# Patient Record
Sex: Female | Born: 1995 | Race: Black or African American | Hispanic: No | Marital: Single | Smoking: Current some day smoker
Health system: Southern US, Community
[De-identification: ages and names within clinical notes are randomized; demographics above are authoritative.]

## PROBLEM LIST (undated history)

## (undated) DIAGNOSIS — J45909 Unspecified asthma, uncomplicated: Secondary | ICD-10-CM

## (undated) DIAGNOSIS — N83209 Unspecified ovarian cyst, unspecified side: Secondary | ICD-10-CM

## (undated) HISTORY — PX: NO PAST SURGERIES: SHX2092

---

## 2018-09-29 ENCOUNTER — Ambulatory Visit
Admission: EM | Admit: 2018-09-29 | Discharge: 2018-09-29 | Disposition: A | Discharge status: Home / Self Care | Payer: Self-pay | Attending: Family Medicine | Admitting: Family Medicine

## 2018-09-29 ENCOUNTER — Other Ambulatory Visit: Payer: Self-pay

## 2018-09-29 DIAGNOSIS — R1084 Generalized abdominal pain: Secondary | ICD-10-CM

## 2018-09-29 MED ORDER — TRAMADOL HCL 50 MG PO TABS: 50.0000 mg | ORAL_TABLET | Freq: Three times a day (TID) | ORAL | 0 refills | Status: DC | PRN

## 2018-09-29 MED ORDER — PROMETHAZINE HCL 25 MG/ML IJ SOLN
25.0000 mg | Freq: Once | INTRAMUSCULAR | Status: AC
  Administered 2018-09-29: 25 mg via INTRAMUSCULAR

## 2018-09-29 MED ORDER — KETOROLAC TROMETHAMINE 60 MG/2ML IM SOLN
60.0000 mg | Freq: Once | INTRAMUSCULAR | Status: AC
  Administered 2018-09-29: 60 mg via INTRAMUSCULAR

## 2018-09-29 NOTE — ED Provider Notes (Signed)
[facility]    CSN: [id number] Arrival date & time: 09/29/18  1650  History   Chief Complaint Chief Complaint  Patient presents with  . Abdominal Pain   HPI  23 year old female presents with severe abdominal pain.  Patient in acute distress due to pain.  History is obtained from her girlfriend.  Per the girlfriend, she has painful menses.  She is complaining of severe abdominal pain.  Patient briefly endorses the fact that she has a history of ovarian cyst.  She has been vomiting as well.  Patient states that she often has to go to the hospital for this.  She states that Toradol often helps her pain.  No other reported symptoms.  No known relieving factors.  No other complaints.  Hx reviewed as below. PMH: Painful menses  Past Surgical History:  Procedure Laterality Date  . NO PAST SURGERIES      OB History   No obstetric history on file.      Home Medications    Prior to Admission medications   Medication Sig Start Date End Date Taking? Authorizing Provider  traMADol (ULTRAM) 50 MG tablet Take 1 tablet (50 mg total) by mouth every 8 (eight) hours as needed for severe pain. 09/29/18   Tommie Sams, DO   Social History Social History   Tobacco Use  . Smoking status: Never Smoker  . Smokeless tobacco: Never Used  Substance Use Topics  . Alcohol use: Never    Frequency: Never  . Drug use: Not Currently     Allergies   Patient has no known allergies.   Review of Systems Review of Systems  Constitutional: Negative for fever.  Gastrointestinal: Positive for abdominal pain, nausea and vomiting.   Physical Exam Triage Vital Signs ED Triage Vitals [09/29/18 1701]  Enc Vitals Group     BP (!) 149/97     Pulse Rate 78     Resp 18     Temp 98 F (36.7 C)     Temp Source Oral     SpO2 100 %     Weight 140 lb (63.5 kg)     Height      Head Circumference      Peak Flow      Pain Score 10     Pain Loc      Pain Edu?      Excl. in GC?     Updated Vital Signs BP (!) 149/97 (BP Location: Left Arm)   Pulse 78   Temp 98 F (36.7 C) (Oral)   Resp 18   Wt 63.5 kg   LMP 09/29/2018   SpO2 100%   Visual Acuity Right Eye Distance:   Left Eye Distance:   Bilateral Distance:    Right Eye Near:   Left Eye Near:    Bilateral Near:     Physical Exam Vitals signs and nursing note reviewed.  Constitutional:      Appearance: She is ill-appearing.     Comments: In distress due to pain.  HENT:     Head: Normocephalic and atraumatic.     Nose: Nose normal.  Eyes:     General:        Right eye: No discharge.        Left eye: No discharge.     Conjunctiva/sclera: Conjunctivae normal.  Cardiovascular:     Rate and Rhythm: Normal rate and regular rhythm.  Pulmonary:     Effort: Pulmonary effort is normal. No  respiratory distress.     Breath sounds: No wheezing or rales.  Abdominal:     General: Abdomen is flat. There is no distension.  Neurological:     Mental Status: She is alert.  Psychiatric:     Comments: Crying (pain).    UC Treatments / Results  Labs (all labs ordered are listed, but only abnormal results are displayed) Labs Reviewed - No data to display  EKG None  Radiology No results found.  Procedures Procedures (including critical care time)  Medications Ordered in UC Medications  promethazine (PHENERGAN) injection 25 mg (25 mg Intramuscular Given 09/29/18 1730)  ketorolac (TORADOL) injection 60 mg (60 mg Intramuscular Given 09/29/18 1729)    Initial Impression / Assessment and Plan / UC Course  I have reviewed the triage vital signs and the nursing notes.  Pertinent labs & imaging results that were available during my care of the patient were reviewed by me and considered in my medical decision making (see chart for details).    23 year old female presents with severe abdominal pain.  This appears to be secondary to menstrual cramps.  IM Toradol and Phenergan given with improvement in pain to  5/10 in severity.  Patient stated that she wanted to go home.  Tramadol given to be used as needed.  Patient is to follow-up with her primary care physician.  Final Clinical Impressions(s) / UC Diagnoses   Final diagnoses:  Generalized abdominal pain     Discharge Instructions     Medication as prescribed.  If pain is uncontrolled, please go to the hospital.  Take care  Dr. Adriana Simas    ED Prescriptions    Medication Sig Dispense Auth. Provider   traMADol (ULTRAM) 50 MG tablet Take 1 tablet (50 mg total) by mouth every 8 (eight) hours as needed for severe pain. 10 tablet Tommie Sams, DO     Controlled Substance Prescriptions [place] Controlled Substance Registry consulted? Not Applicable   Tommie Sams, DO 09/29/18 4098

## 2018-09-29 NOTE — ED Triage Notes (Signed)
Patient complains of abdominal pain that started this morning. Patient girlfriend reports that her menstrual cycle normally causes this pain. Patient reports that they here visiting from [place]. Patient vomiting during triage.

## 2018-09-29 NOTE — Discharge Instructions (Signed)
Medication as prescribed.  If pain is uncontrolled, please go to the hospital.  Take care  Dr. Adriana Simas

## 2019-02-06 ENCOUNTER — Encounter: Payer: Self-pay | Admitting: Emergency Medicine

## 2019-02-06 ENCOUNTER — Other Ambulatory Visit: Payer: Self-pay

## 2019-02-06 ENCOUNTER — Ambulatory Visit
Admission: EM | Admit: 2019-02-06 | Discharge: 2019-02-06 | Disposition: A | Discharge status: Home / Self Care | Payer: Self-pay | Attending: Family Medicine | Admitting: Family Medicine

## 2019-02-06 DIAGNOSIS — R102 Pelvic and perineal pain: Secondary | ICD-10-CM

## 2019-02-06 MED ORDER — KETOROLAC TROMETHAMINE 10 MG PO TABS: 10.0000 mg | ORAL_TABLET | Freq: Four times a day (QID) | ORAL | 0 refills | Status: DC | PRN

## 2019-02-06 MED ORDER — KETOROLAC TROMETHAMINE 60 MG/2ML IM SOLN
60.0000 mg | Freq: Once | INTRAMUSCULAR | Status: AC
  Administered 2019-02-06: 11:00:00 60 mg via INTRAMUSCULAR

## 2019-02-06 MED ORDER — PROMETHAZINE HCL 25 MG PO TABS: 25.0000 mg | ORAL_TABLET | Freq: Three times a day (TID) | ORAL | 0 refills | Status: DC | PRN

## 2019-02-06 MED ORDER — ONDANSETRON HCL 4 MG/2ML IJ SOLN
4.0000 mg | Freq: Once | INTRAMUSCULAR | Status: AC
  Administered 2019-02-06: 4 mg via INTRAMUSCULAR

## 2019-02-06 NOTE — ED Provider Notes (Signed)
[facility]    CSN: [id number] Arrival date & time: 02/06/19  1011  History   Chief Complaint Chief Complaint  Patient presents with  . Abdominal Pain   HPI  23 year old female presents with abdominal pain.  I have seen the patient previously for this in March.  She presents similarly today.  Patient is in acute distress and is in severe pain (10/10).  Patient states that she drove herself here.  She has severe dysmenorrhea.  She reports severe lower abdominal pain/cramping.  She has previously endorsed to me that she has a history of ovarian cyst.  She has had some diarrhea and some vomiting in addition to her severe pain.  No relieving factors.  No other reported symptoms.  History reviewed as below. PMH: Dysmenorrhea  Past Surgical History:  Procedure Laterality Date  . NO PAST SURGERIES      OB History   No obstetric history on file.    Home Medications    Prior to Admission medications   Medication Sig Start Date End Date Taking? Authorizing Provider  ketorolac (TORADOL) 10 MG tablet Take 1 tablet (10 mg total) by mouth every 6 (six) hours as needed for moderate pain or severe pain. 02/06/19   Tommie Samsook, Branon Sabine G, DO  promethazine (PHENERGAN) 25 MG tablet Take 1 tablet (25 mg total) by mouth every 8 (eight) hours as needed for nausea or vomiting. 02/06/19   Tommie Samsook, Dian Minahan G, DO  traMADol (ULTRAM) 50 MG tablet Take 1 tablet (50 mg total) by mouth every 8 (eight) hours as needed for severe pain. 09/29/18   Tommie Samsook, Quantae Martel G, DO    Social History Social History   Tobacco Use  . Smoking status: Never Smoker  . Smokeless tobacco: Never Used  Substance Use Topics  . Alcohol use: Never    Frequency: Never  . Drug use: Not Currently     Allergies   Patient has no known allergies.   Review of Systems Review of Systems  Gastrointestinal: Positive for abdominal pain.  Genitourinary: Positive for menstrual problem.   Physical Exam Triage Vital Signs ED Triage  Vitals  Enc Vitals Group     BP 02/06/19 1022 (!) 105/57     Pulse Rate 02/06/19 1022 79     Resp 02/06/19 1022 20     Temp 02/06/19 1022 98.4 F (36.9 C)     Temp Source 02/06/19 1022 Oral     SpO2 02/06/19 1022 100 %     Weight --      Height --      Head Circumference --      Peak Flow --      Pain Score 02/06/19 1019 10     Pain Loc --      Pain Edu? --      Excl. in GC? --    Updated Vital Signs BP (!) 105/57 (BP Location: Right Arm)   Pulse 79   Temp 98.4 F (36.9 C) (Oral)   Resp 20   LMP 02/05/2019 (Exact Date)   SpO2 100%   Visual Acuity Right Eye Distance:   Left Eye Distance:   Bilateral Distance:    Right Eye Near:   Left Eye Near:    Bilateral Near:     Physical Exam Vitals signs and nursing note reviewed.  Constitutional:      Comments: Patient in distress secondary to severe pain.  She is holding her lower abdomen and is curled up in the fetal position.  Patient is crying.  HENT:     Head: Normocephalic and atraumatic.  Eyes:     General:        Right eye: No discharge.        Left eye: No discharge.     Conjunctiva/sclera: Conjunctivae normal.  Cardiovascular:     Rate and Rhythm: Normal rate and regular rhythm.  Pulmonary:     Effort: Pulmonary effort is normal.     Breath sounds: Normal breath sounds.  Abdominal:     General: Abdomen is flat. There is no distension.  Skin:    General: Skin is warm.     Findings: No rash.  Psychiatric:     Comments: Patient is crying in pain.    UC Treatments / Results  Labs (all labs ordered are listed, but only abnormal results are displayed) Labs Reviewed - No data to display  EKG   Radiology No results found.  Procedures Procedures (including critical care time)  Medications Ordered in UC Medications  ketorolac (TORADOL) injection 60 mg (60 mg Intramuscular Given 02/06/19 1031)  ondansetron (ZOFRAN) injection 4 mg (4 mg Intramuscular Given 02/06/19 1031)    Initial Impression /  Assessment and Plan / UC Course  I have reviewed the triage vital signs and the nursing notes.  Pertinent labs & imaging results that were available during my care of the patient were reviewed by me and considered in my medical decision making (see chart for details).    23 year old female presents with pelvic pain/dysmenorrhea.  Toradol and Zofran given today with market improvement in pain to 4/10 in severity.  Discharging home on Toradol and Phenergan.  Final Clinical Impressions(s) / UC Diagnoses   Final diagnoses:  Pelvic pain     Discharge Instructions     Rest.  Fluids.  Medication as directed. Do not start until after 5 pm as we gave you a dose here.  Take care  Dr. Lacinda Axon     ED Prescriptions    Medication Sig Dispense Auth. Provider   ketorolac (TORADOL) 10 MG tablet Take 1 tablet (10 mg total) by mouth every 6 (six) hours as needed for moderate pain or severe pain. 20 tablet Lyndell Gillyard G, DO   promethazine (PHENERGAN) 25 MG tablet Take 1 tablet (25 mg total) by mouth every 8 (eight) hours as needed for nausea or vomiting. 30 tablet Coral Spikes, DO     Controlled Substance Prescriptions [place] Controlled Substance Registry consulted? Not Applicable   Coral Spikes, DO 02/06/19 1127

## 2019-02-06 NOTE — ED Triage Notes (Signed)
Pt c/o lower abdominal pain. She states that it started this morning. She started her menstrual cycle last night and she has had pain in the past like this when her periods. She has had diarrhea this morning.

## 2019-02-06 NOTE — Discharge Instructions (Signed)
Rest.  Fluids.  Medication as directed. Do not start until after 5 pm as we gave you a dose here.  Take care  Dr. Lacinda Axon

## 2019-03-28 ENCOUNTER — Ambulatory Visit
Admission: EM | Admit: 2019-03-28 | Discharge: 2019-03-28 | Disposition: A | Discharge status: Home / Self Care | Payer: Self-pay | Attending: Emergency Medicine | Admitting: Emergency Medicine

## 2019-03-28 ENCOUNTER — Encounter: Payer: Self-pay | Admitting: Emergency Medicine

## 2019-03-28 ENCOUNTER — Other Ambulatory Visit: Payer: Self-pay

## 2019-03-28 DIAGNOSIS — N946 Dysmenorrhea, unspecified: Secondary | ICD-10-CM

## 2019-03-28 MED ORDER — TRAMADOL HCL 50 MG PO TABS: 50.0000 mg | ORAL_TABLET | Freq: Three times a day (TID) | ORAL | 0 refills | Status: DC | PRN

## 2019-03-28 MED ORDER — KETOROLAC TROMETHAMINE 60 MG/2ML IM SOLN
60.0000 mg | Freq: Once | INTRAMUSCULAR | Status: AC
  Administered 2019-03-28: 11:00:00 60 mg via INTRAMUSCULAR

## 2019-03-28 NOTE — ED Triage Notes (Signed)
Patient c/o pelvic pain that started this morning.  Patient states that she started her menstrual cycle yesterday.  Patient states that she took Pamprin about a hour ago.

## 2019-03-28 NOTE — Discharge Instructions (Addendum)
Take medication as prescribed.  Over-the-counter ibuprofen 3 times a day.  Heating pad to abdomen.  Drink plenty of water.  Rest.  It is very important for you to follow-up with OB/GYN as discussed.  See above to call.  Follow up with your primary care physician this week as needed.  Return to urgent care as needed.  For any increased pain, pain not improving today or worsening concerns proceed directly to the emergency room as discussed.

## 2019-03-28 NOTE — ED Provider Notes (Addendum)
[facility] ____________________________________________  Time seen: Approximately 11:40 AM  I have reviewed the triage vital signs and the nursing notes.   HISTORY  Chief Complaint Pelvic Pain  HPI Alice Lewis is a 23 y.o. female presenting for evaluation of lower abdominal cramping and painful menstrual cycle.  Patient has a history of severe dysmenorrhea.  Patient has been seen twice before this year in urgent care for the same complaints with same presentation per patient.  Patient states her menstrual cycle started yesterday and has gradually increase in lower abdominal cramping pain.  Denies aggravating or alleviating factors.  No pain radiation.  States feels consistent with previous menstrual cramping pain.  Reports years ago she was diagnosed with ovarian cyst without any further complication.  No vomiting, diarrhea, fevers, chest pain, shortness of breath or recent sickness.  Patient reports she is in a monogamous homosexual relationship, declines pregnancy and declines any concerns of STDs.  No dysuria.  Took 1 Pamprin this morning without resolution.  States she has not been seen by OB/GYN for this.   past medical history Dysmenorrhea  There are no active problems to display for this patient.   Past Surgical History:  Procedure Laterality Date  . NO PAST SURGERIES       No current facility-administered medications for this encounter.   Current Outpatient Medications:  .  traMADol (ULTRAM) 50 MG tablet, Take 1 tablet (50 mg total) by mouth every 8 (eight) hours as needed., Disp: 8 tablet, Rfl: 0  Allergies Patient has no known allergies.   family history Aunt: Dysmenorrhea  Social History Social History   Tobacco Use  . Smoking status: Never Smoker  . Smokeless tobacco: Never Used  Substance Use Topics  . Alcohol use: Never    Frequency: Never  . Drug use: Not Currently    Review of Systems Constitutional: No fever ENT: No sore  throat. Cardiovascular: Denies chest pain. Respiratory: Denies shortness of breath. Gastrointestinal: Positive abdominal pain.  No nausea, no vomiting.  No diarrhea.  No constipation. Genitourinary: Negative for dysuria. Skin: Negative for rash.  ____________________________________________   PHYSICAL EXAM:  VITAL SIGNS: ED Triage Vitals  Enc Vitals Group     BP 03/28/19 1047 105/77     Pulse Rate 03/28/19 1047 73     Resp 03/28/19 1047 16     Temp 03/28/19 1047 98.2 F (36.8 C)     Temp Source 03/28/19 1047 Oral     SpO2 03/28/19 1047 100 %     Weight 03/28/19 1046 139 lb 15.9 oz (63.5 kg)     Height --      Head Circumference --      Peak Flow --      Pain Score 03/28/19 1046 10     Pain Loc --      Pain Edu? --      Excl. in [place]? --     Constitutional: Alert and oriented.  ENT      Head: Normocephalic and atraumatic. Cardiovascular: Normal rate, regular rhythm. Grossly normal heart sounds.  Good peripheral circulation. Respiratory: Normal respiratory effort without tachypnea nor retractions. Breath sounds are clear and equal bilaterally. No wheezes, rales, rhonchi. Gastrointestinal:  Normal Bowel sounds.  Minimal suprapubic and diffuse lower suprapubic tenderness palpation.  Abdomen otherwise soft and nontender.  No CVA tenderness.  Patient lying on side crying, during discussion set up well. Neurologic:  Normal speech and language.  Skin:  Skin is warm, dry and intact. No rash  noted. Psychiatric: Patient crying.   ___________________________________________   LABS (all labs ordered are listed, but only abnormal results are displayed)  Labs Reviewed - No data to display   PROCEDURES Procedures    INITIAL IMPRESSION / ASSESSMENT AND PLAN / ED COURSE  Pertinent labs & imaging results that were available during my care of the patient were reviewed by me and considered in my medical decision making (see chart for details).  Patient crying and tearful in room  expressing pain from her menstrual cycle.  Patient has known recurrent dysmenorrhea.  Has not been followed with OB/GYN.  States pain is consistent with her normal menstrual cycles without acute changes.  Menstrual cycle started yesterday.  Reports in the past Toradol helped her well.  60 mg IM Toradol given once in urgent care, patient reports improved pain.  Quantity 8 tramadol given for at home.  Over-the-counter ibuprofen.  Discussed in detail with patient if pain increases or does not improve throughout the day proceed erectly to emergency room for imaging and strongly encouraged and discussed the need for her to follow-up with OB/GYN.Discussed indication, risks and benefits of medications with patient.  Discussed follow up and return parameters including no resolution or any worsening concerns. Patient verbalized understanding and agreed to plan.    [place] controlled substance database reviewed, no recent controlled substances documented. ____________________________________________   FINAL CLINICAL IMPRESSION(S) / ED DIAGNOSES  Final diagnoses:  Dysmenorrhea     ED Discharge Orders         Ordered    traMADol (ULTRAM) 50 MG tablet  Every 8 hours PRN     03/28/19 1142           Note: This dictation was prepared with Dragon dictation along with smaller phrase technology. Any transcriptional errors that result from this process are unintentional.         Renford DillsMiller, Parag Dorton, NP 03/28/19 1320

## 2019-04-19 ENCOUNTER — Other Ambulatory Visit: Payer: Self-pay

## 2019-04-19 ENCOUNTER — Encounter: Payer: Self-pay | Admitting: Emergency Medicine

## 2019-04-19 ENCOUNTER — Emergency Department
Admission: EM | Admit: 2019-04-19 | Discharge: 2019-04-19 | Disposition: A | Discharge status: Home / Self Care | Payer: Self-pay | Attending: Emergency Medicine | Admitting: Emergency Medicine

## 2019-04-19 ENCOUNTER — Emergency Department: Payer: Self-pay

## 2019-04-19 DIAGNOSIS — Z3202 Encounter for pregnancy test, result negative: Secondary | ICD-10-CM | POA: Insufficient documentation

## 2019-04-19 DIAGNOSIS — R112 Nausea with vomiting, unspecified: Secondary | ICD-10-CM | POA: Insufficient documentation

## 2019-04-19 DIAGNOSIS — R102 Pelvic and perineal pain: Secondary | ICD-10-CM | POA: Insufficient documentation

## 2019-04-19 LAB — COMPREHENSIVE METABOLIC PANEL
ALT: 13 U/L (ref 0–44)
AST: 23 U/L (ref 15–41)
Albumin: 4.1 g/dL (ref 3.5–5.0)
Alkaline Phosphatase: 60 U/L (ref 38–126)
Anion gap: 8 (ref 5–15)
BUN: 11 mg/dL (ref 6–20)
CO2: 23 mmol/L (ref 22–32)
Calcium: 9.2 mg/dL (ref 8.9–10.3)
Chloride: 109 mmol/L (ref 98–111)
Creatinine, Ser: 0.74 mg/dL (ref 0.44–1.00)
GFR calc Af Amer: >60 mL/min (ref >60)
GFR calc non Af Amer: >60 mL/min (ref >60)
Glucose, Bld: 143 mg/dL — ABNORMAL HIGH (ref 70–99)
Potassium: 3.4 mmol/L — ABNORMAL LOW (ref 3.5–5.1)
Sodium: 140 mmol/L (ref 135–145)
Total Bilirubin: 0.7 mg/dL (ref 0.3–1.2)
Total Protein: 7.3 g/dL (ref 6.5–8.1)

## 2019-04-19 LAB — CBC
HCT: 33.4 % — ABNORMAL LOW (ref 36.0–46.0)
Hemoglobin: 10.4 g/dL — ABNORMAL LOW (ref 12.0–15.0)
MCH: 24.1 pg — ABNORMAL LOW (ref 26.0–34.0)
MCHC: 31.1 g/dL (ref 30.0–36.0)
MCV: 77.3 fL — ABNORMAL LOW (ref 80.0–100.0)
Platelets: 271 10*3/uL (ref 150–400)
RBC: 4.32 MIL/uL (ref 3.87–5.11)
RDW: 18 % — ABNORMAL HIGH (ref 11.5–15.5)
WBC: 7.6 10*3/uL (ref 4.0–10.5)
nRBC: 0 % (ref 0.0–0.2)

## 2019-04-19 LAB — URINALYSIS, COMPLETE (UACMP) WITH MICROSCOPIC
Bacteria, UA: NONE SEEN
Bilirubin Urine: NEGATIVE
Glucose, UA: NEGATIVE mg/dL
Ketones, ur: NEGATIVE mg/dL
Leukocytes,Ua: NEGATIVE
Nitrite: NEGATIVE
Protein, ur: 30 mg/dL — AB
RBC / HPF: >50 RBC/hpf — ABNORMAL HIGH (ref 0–5)
Specific Gravity, Urine: 1.023 (ref 1.005–1.030)
pH: 6 (ref 5.0–8.0)

## 2019-04-19 LAB — LIPASE, BLOOD: Lipase: 24 U/L (ref 11–51)

## 2019-04-19 LAB — POCT PREGNANCY, URINE: Preg Test, Ur: NEGATIVE

## 2019-04-19 MED ORDER — ONDANSETRON 4 MG PO TBDP
4.0000 mg | ORAL_TABLET | Freq: Once | ORAL | Status: AC | PRN
  Administered 2019-04-19: 4 mg via ORAL
  Filled 2019-04-19: qty 1

## 2019-04-19 MED ORDER — PROMETHAZINE HCL 12.5 MG PO TABS: 25.0000 mg | ORAL_TABLET | Freq: Four times a day (QID) | ORAL | 0 refills | Status: DC | PRN

## 2019-04-19 MED ORDER — SODIUM CHLORIDE 0.9% FLUSH: 3.0000 mL | Freq: Once | INTRAVENOUS | Status: DC

## 2019-04-19 MED ORDER — MORPHINE SULFATE (PF) 4 MG/ML IV SOLN
4.0000 mg | Freq: Once | INTRAVENOUS | Status: AC
  Administered 2019-04-19: 4 mg via INTRAMUSCULAR
  Filled 2019-04-19: qty 1

## 2019-04-19 MED ORDER — KETOROLAC TROMETHAMINE 30 MG/ML IJ SOLN
30.0000 mg | Freq: Once | INTRAMUSCULAR | Status: AC
  Administered 2019-04-19: 30 mg via INTRAMUSCULAR
  Filled 2019-04-19: qty 1

## 2019-04-19 MED ORDER — KETOROLAC TROMETHAMINE 10 MG PO TABS: 10.0000 mg | ORAL_TABLET | Freq: Four times a day (QID) | ORAL | 0 refills | Status: DC | PRN

## 2019-04-19 MED ORDER — PROMETHAZINE HCL 25 MG PO TABS
25.0000 mg | ORAL_TABLET | Freq: Once | ORAL | Status: AC
  Administered 2019-04-19: 25 mg via ORAL
  Filled 2019-04-19: qty 1

## 2019-04-19 NOTE — ED Provider Notes (Signed)
[facility] Emergency Department Provider Note  ____________________________________________   First MD Initiated Contact with Patient 04/19/19 0601     (approximate)  I have reviewed the triage vital signs and the nursing notes.   HISTORY  Chief Complaint Abdominal Pain  level 5 caveat: Patient is minimally cooperative with history and physical and gives only minimal answers to questions.  HPI Alice Lewis is a 23 y.o. female who presents tonight by private vehicle for evaluation of acute onset severe pelvic pain that feels like severe cramping .  It started around midnight.  She has had several episodes of nausea and vomiting.  She did not take anything at home.  This is happened monthly for the last several months and seems to be associated with her menstrual cycle which started yesterday.  She has been seen previously at the urgent care twice and always improved after intramuscular Toradol and Phenergan.  She denies fever, cough, chest pain, and shortness of breath.  She will not answer any questions about what makes the symptoms better or worse but she does not when asked if this is similar to her prior episodes.  She denies any dysuria and will not give a urine specimen.  She received intramuscular morphine while she was waiting for an exam bed.         History reviewed. No pertinent past medical history.  There are no active problems to display for this patient.   Past Surgical History:  Procedure Laterality Date  . NO PAST SURGERIES      Prior to Admission medications   Medication Sig Start Date End Date Taking? Authorizing Provider  ketorolac (TORADOL) 10 MG tablet Take 1 tablet (10 mg total) by mouth every 6 (six) hours as needed. 04/19/19   Loleta Rose, MD  promethazine (PHENERGAN) 12.5 MG tablet Take 2 tablets (25 mg total) by mouth every 6 (six) hours as needed for nausea or vomiting. 04/19/19   Loleta Rose, MD    Allergies  Patient has no known allergies.  History reviewed. No pertinent family history.  Social History Social History   Tobacco Use  . Smoking status: Never Smoker  . Smokeless tobacco: Never Used  Substance Use Topics  . Alcohol use: Never    Frequency: Never  . Drug use: Not Currently    Review of Systems level 5 caveat: Patient is minimally cooperative with history and physical and gives only minimal answers to questions.  Constitutional: No fever/chills Cardiovascular: Denies chest pain. Respiratory: Denies shortness of breath. Gastrointestinal: Lower abdominal pain with N/V Genitourinary: Began menstrual cycle yesterday. Negative for dysuria. Musculoskeletal: Negative for neck pain.  Negative for back pain. Integumentary: Negative for rash. Neurological: Negative for headaches, focal weakness or numbness.   ____________________________________________   PHYSICAL EXAM:  VITAL SIGNS: ED Triage Vitals [04/19/19 0337]  Enc Vitals Group     BP (!) 96/53     Pulse Rate 86     Resp 20     Temp 97.8 F (36.6 C)     Temp Source Oral     SpO2 98 %     Weight 64.4 kg (142 lb)     Height 1.676 m (5\' 6" )     Head Circumference      Peak Flow      Pain Score 10     Pain Loc      Pain Edu?      Excl. in [place]?     Constitutional: Alert and oriented.  Appears uncomfortable.   Eyes: Conjunctivae are normal.  Head: Atraumatic. Nose: No congestion/rhinnorhea. Mouth/Throat: Mucous membranes are moist. Neck: No stridor.  No meningeal signs.   Cardiovascular: Normal rate, regular rhythm. Good peripheral circulation. Grossly normal heart sounds. Respiratory: Normal respiratory effort.  No retractions. Gastrointestinal: Thin body habitus. Soft and nontender. No distention.  Patient did not show any reaction to palpation of the abdomen. GU:  deferred Musculoskeletal: No lower extremity tenderness nor edema. No gross deformities of extremities. Neurologic:  Normal speech and  language. No gross focal neurologic deficits are appreciated.  Skin:  Skin is warm, dry and intact. Psychiatric: Mood and affect are quiet and minimally interactive.  Moans occasionally.  ____________________________________________   LABS (all labs ordered are listed, but only abnormal results are displayed)  Labs Reviewed  COMPREHENSIVE METABOLIC PANEL - Abnormal; Notable for the following components:      Result Value   Potassium 3.4 (*)    Glucose, Bld 143 (*)    All other components within normal limits  CBC - Abnormal; Notable for the following components:   Hemoglobin 10.4 (*)    HCT 33.4 (*)    MCV 77.3 (*)    MCH 24.1 (*)    RDW 18.0 (*)    All other components within normal limits  LIPASE, BLOOD  URINALYSIS, COMPLETE (UACMP) WITH MICROSCOPIC  POCT PREGNANCY, URINE  POC URINE PREG, ED   ____________________________________________  EKG  No indication for EKG ____________________________________________  RADIOLOGY I, Hinda Kehr, personally viewed and evaluated these images (plain radiographs) as part of my medical decision making, as well as reviewing the written report by the radiologist.  ED MD interpretation: No acute abnormalities identified on pelvic ultrasound including Dopplers.  Official radiology report(s): US Pelvis (transabdominal Only)  Result Date: 04/19/2019 CLINICAL DATA:  Pelvic pain for 4 hours EXAM: TRANSABDOMINAL AND TRANSVAGINAL ULTRASOUND OF PELVIS DOPPLER ULTRASOUND OF OVARIES TECHNIQUE: Both transabdominal and transvaginal ultrasound examinations of the pelvis were performed. Transabdominal technique was performed for global imaging of the pelvis including uterus, ovaries, adnexal regions, and pelvic cul-de-sac. It was necessary to proceed with endovaginal exam following the transabdominal exam to visualize the endometrium. Color and duplex Doppler ultrasound was utilized to evaluate blood flow to the ovaries. COMPARISON:  None. FINDINGS:  Uterus Measurements: 7 x 4 x 4 cm = volume: 50 mL. No fibroids or other mass visualized. Endometrium Thickness: 7.  Arcuate morphology.  No focal abnormality. Right ovary Measurements: 29 x 15 x 21 mm = volume: 5 mL. Normal appearance/no adnexal mass. Left ovary Measurements: 25 x 15 x 17 mm = volume: 3 mL. Normal appearance/no adnexal mass. Pulsed Doppler evaluation of both ovaries demonstrates normal low-resistance waveforms. Other findings No abnormal free fluid. IMPRESSION: Normal pelvic ultrasound. Electronically Signed   By: Monte Fantasia M.D.   On: 04/19/2019 05:26   US Pelvic Doppler (torsion R/o Or Mass Arterial Flow)  Result Date: 04/19/2019 CLINICAL DATA:  Pelvic pain for 4 hours EXAM: TRANSABDOMINAL AND TRANSVAGINAL ULTRASOUND OF PELVIS DOPPLER ULTRASOUND OF OVARIES TECHNIQUE: Both transabdominal and transvaginal ultrasound examinations of the pelvis were performed. Transabdominal technique was performed for global imaging of the pelvis including uterus, ovaries, adnexal regions, and pelvic cul-de-sac. It was necessary to proceed with endovaginal exam following the transabdominal exam to visualize the endometrium. Color and duplex Doppler ultrasound was utilized to evaluate blood flow to the ovaries. COMPARISON:  None. FINDINGS: Uterus Measurements: 7 x 4 x 4 cm = volume: 50 mL. No fibroids or  other mass visualized. Endometrium Thickness: 7.  Arcuate morphology.  No focal abnormality. Right ovary Measurements: 29 x 15 x 21 mm = volume: 5 mL. Normal appearance/no adnexal mass. Left ovary Measurements: 25 x 15 x 17 mm = volume: 3 mL. Normal appearance/no adnexal mass. Pulsed Doppler evaluation of both ovaries demonstrates normal low-resistance waveforms. Other findings No abnormal free fluid. IMPRESSION: Normal pelvic ultrasound. Electronically Signed   By: Marnee Spring M.D.   On: 04/19/2019 05:26    ____________________________________________   PROCEDURES   Procedure(s) performed  (including Critical Care):  Procedures   ____________________________________________   INITIAL IMPRESSION / MDM / ASSESSMENT AND PLAN / ED COURSE  As part of my medical decision making, I reviewed the following data within the electronic MEDICAL RECORD NUMBER Nursing notes reviewed and incorporated, Labs reviewed , Old chart reviewed, Notes from prior ED visits and [place] Controlled Substance Database   Differential diagnosis includes, but is not limited to, pelvic pain associated with menstrual cycle, ovarian torsion, hemorrhagic ovarian cyst, STD/PID, UTI/pyelonephritis, less likely SBO, ileus, appendicitis.  I reviewed the medical record and see that this is happened multiple times on an essentially monthly basis for the last few months.  She has not followed up with OB/GYN.  She has not previously had an ultrasound which fortunately was reassuring tonight with no evidence of torsion or large cyst or pelvic free fluid.  She is not providing a urine specimen but she is denying dysuria and she has a female partner and says that there is no chance that she could be pregnant so I do not believe it is critical.  She had some brief improvement of the pain after intramuscular morphine in triage while awaiting in exam room.  She was ambulatory when I went with her nurse, Elijah Birk, to check on her, but when she saw that we were in the room she required some assistance to get back in her bed.  Her lab work is reassuring with no leukocytosis and no other acute abnormalities.  I do not believe she would benefit from a CT scan.  The medical record indicates that she has improved substantially in the past with an intramuscular injection of Toradol as well as some Phenergan which I am providing tonight with Toradol 30 mg intramuscular and Phenergan 25 mg by mouth.  I gave her the option of a Phenergan intramuscular injection but she said that a pill would be fine.  I strongly encouraged her to follow-up with OB/GYN and I gave  a prescription for Toradol pills as well as Phenergan tablets.  I gave my usual customary return precautions.          ____________________________________________  FINAL CLINICAL IMPRESSION(S) / ED DIAGNOSES  Final diagnoses:  Pelvic pain  Nausea and vomiting, intractability of vomiting not specified, unspecified vomiting type     MEDICATIONS GIVEN DURING THIS VISIT:  Medications  sodium chloride flush (NS) 0.9 % injection 3 mL (has no administration in time range)  ketorolac (TORADOL) 30 MG/ML injection 30 mg (has no administration in time range)  promethazine (PHENERGAN) tablet 25 mg (has no administration in time range)  ondansetron (ZOFRAN-ODT) disintegrating tablet 4 mg (4 mg Oral Given 04/19/19 0343)  morphine 4 MG/ML injection 4 mg (4 mg Intramuscular Given 04/19/19 0354)     ED Discharge Orders         Ordered    ketorolac (TORADOL) 10 MG tablet  Every 6 hours PRN     04/19/19 1610  promethazine (PHENERGAN) 12.5 MG tablet  Every 6 hours PRN     04/19/19 [id number]          *Please note:  Madelin HeadingsMarkuarysi Greiner was evaluated in Emergency Department on 04/19/2019 for the symptoms described in the history of present illness. She was evaluated in the context of the global COVID-19 pandemic, which necessitated consideration that the patient might be at risk for infection with the SARS-CoV-2 virus that causes COVID-19. Institutional protocols and algorithms that pertain to the evaluation of patients at risk for COVID-19 are in a state of rapid change based on information released by regulatory bodies including the CDC and federal and state organizations. These policies and algorithms were followed during the patient's care in the ED.  Some ED evaluations and interventions may be delayed as a result of limited staffing during the pandemic.*  Note:  This document was prepared using Dragon voice recognition software and may include unintentional dictation errors.   Loleta RoseForbach, Jarell Mcewen, MD  04/19/19 [phone]

## 2019-04-19 NOTE — ED Triage Notes (Signed)
Patient wheelchair to triage with complaints of abdominal (points to below the umbilicus) cramping pain since midnight with sudden onset. Pt reports N/V x 2 . Pt denies taking medications before coming to ED.  Pt unable to sit still for VS.  Pt reports hx of cyst and is currently menstruating - reports pain is typical during "period"

## 2019-04-19 NOTE — ED Notes (Signed)
Pt discharged home after verbalizing understanding of discharge instructions; nad noted. 

## 2019-04-19 NOTE — ED Notes (Addendum)
Verbal from [place], MD, for IM morphine  Pt reports unable to give urine, when asked if hx of pregnancy or possibility, pt reports: "naw! I'm gay!"

## 2019-04-19 NOTE — ED Notes (Signed)
Pt moved to subwait

## 2019-04-19 NOTE — Discharge Instructions (Signed)
As we discussed, it seems that you are having symptoms every month associated with your menstrual cycle.  There is no evidence of any emergent medical condition tonight; your lab work is all normal and your ultrasound was normal as well with no sign of torsion or other emergent medical condition.  Please take the prescribed medications as indicated on the labels and follow-up at the next available opportunity with Dr. Ouida Sills or another OB/GYN to discuss your ongoing issues.  Return to the nearest emergency department with new or worsening symptoms that concern you.

## 2019-07-09 ENCOUNTER — Other Ambulatory Visit: Payer: Self-pay

## 2019-07-09 ENCOUNTER — Encounter: Payer: Self-pay | Admitting: Emergency Medicine

## 2019-07-09 ENCOUNTER — Ambulatory Visit
Admission: EM | Admit: 2019-07-09 | Discharge: 2019-07-09 | Disposition: A | Discharge status: Home / Self Care | Payer: Self-pay | Attending: Family Medicine | Admitting: Family Medicine

## 2019-07-09 DIAGNOSIS — R102 Pelvic and perineal pain: Secondary | ICD-10-CM

## 2019-07-09 DIAGNOSIS — R1030 Lower abdominal pain, unspecified: Secondary | ICD-10-CM

## 2019-07-09 DIAGNOSIS — N946 Dysmenorrhea, unspecified: Secondary | ICD-10-CM

## 2019-07-09 NOTE — ED Triage Notes (Signed)
Patient in today c/o abdominal pain x today. Patient states this is the same as previous times. Patient started her menses yesterday.

## 2019-07-09 NOTE — ED Triage Notes (Signed)
I was at bedside with Alice Land, NP during patient's entire exam. Patient refused to go to ED. Patient signed out AMA.

## 2019-07-09 NOTE — ED Provider Notes (Signed)
[facility] ____________________________________________  Time seen: Approximately 7:54 PM  I have reviewed the triage vital signs and the nursing notes.   HISTORY  Chief Complaint Abdominal Pain  HPI, exam as well as recommendations were completed with Nevin Bloodgood CMA at bedside as chaperone.  HPI Alice Lewis is a 23 y.o. female presenting for evaluation of 10 out of 10 lower abdominal pain.  Patient reports this started yesterday and reports that this is her pain from her period.  During asking question, patient reports refer back to previous visits as it is on "our records ".  Patient states she is in a lot of pain and consistently states "help me ".  Patient laying on exam table on her stomach and rolling back and forth moaning in pain and some questions hard to understand her answer.  Patient states what is on our records have previously helped her pain.  States she gets this with her periods.  Denies dysuria, diarrhea, vaginal discharge, concern of STDs.  States she did have 2 episodes of vomiting today due to the pain.  Reports she is in a homosexual relationship and denies any chance of pregnancy.  No recent cough, fevers, chest pain, shortness of breath.  States she has not taking any medication for the same complaints. Reports history of ovarian cysts. Has not recently followed with OBGYN per patient.    History reviewed. No pertinent past medical history. Ovarian Cyst  There are no problems to display for this patient.   Past Surgical History:  Procedure Laterality Date  . NO PAST SURGERIES       No current facility-administered medications for this encounter.  Current Outpatient Medications:  .  ketorolac (TORADOL) 10 MG tablet, Take 1 tablet (10 mg total) by mouth every 6 (six) hours as needed., Disp: 20 tablet, Rfl: 0 .  promethazine (PHENERGAN) 12.5 MG tablet, Take 2 tablets (25 mg total) by mouth every 6 (six) hours as needed for nausea or vomiting.,  Disp: 30 tablet, Rfl: 0  Allergies Patient has no known allergies.  Family History  Problem Relation Age of Onset  . Stroke Mother   . Healthy Father     Social History Social History   Tobacco Use  . Smoking status: Never Smoker  . Smokeless tobacco: Never Used  Substance Use Topics  . Alcohol use: Never  . Drug use: Not Currently    Review of Systems Constitutional: No fever ENT: No sore throat. Cardiovascular: Denies chest pain. Respiratory: Denies shortness of breath. Gastrointestinal: Positive abdominal pain.  Positive nausea vomiting. Genitourinary: Negative for dysuria. Skin: Negative for rash.   ____________________________________________   PHYSICAL EXAM:  VITAL SIGNS: ED Triage Vitals  Enc Vitals Group     BP 07/09/19 1905 (!) 128/110     Pulse Rate 07/09/19 1905 94     Resp 07/09/19 1905 18     Temp 07/09/19 1905 97.9 F (36.6 C)     Temp Source 07/09/19 1905 Oral     SpO2 --      Weight 07/09/19 1905 134 lb (60.8 kg)     Height 07/09/19 1905 5\' 5"  (1.651 m)     Head Circumference --      Peak Flow --      Pain Score 07/09/19 1904 10     Pain Loc --      Pain Edu? --      Excl. in [place]? --   Patient moving while nursing staff trying to obtain vitals.  Constitutional: Alert and oriented.  Patient moaning and rocking on exam table. ENT      Head: Normocephalic and atraumatic. Cardiovascular: Normal rate, regular rhythm.  Respiratory: Normal respiratory effort without tachypnea nor retractions. Breath sounds are clear and equal bilaterally. No wheezes, rales, rhonchi. Gastrointestinal: Normal bowel sounds.  Moderate diffuse lower pelvic tenderness to palpation, however no rebound tenderness noted, no guarding.  Abdomen otherwise soft and nontender. Musculoskeletal: Movement of all extremities. Neurologic:  Normal speech and language. Skin:  Skin is warm, dry and intact. No rash noted. Psychiatric: Mood and affect are normal. Speech and behavior  are normal. Patient exhibits appropriate insight and judgment   ___________________________________________   LABS (all labs ordered are listed, but only abnormal results are displayed)  Labs Reviewed - No data to display  PROCEDURES Procedures    INITIAL IMPRESSION / ASSESSMENT AND PLAN / ED COURSE  Pertinent labs & imaging results that were available during my care of the patient were reviewed by me and considered in my medical decision making (see chart for details).  HPI, exam and recommendations completed with Gunnar Fusi CMA at bedside as chaperone.  Patient complaining of dysmenorrhea, screaming in exam room, moaning in pain and requesting for help and states she needs something so she can sleep for the pain.  Patient reported of pain appears disproportionate to dysmenorrhea.  Discussed multiple differentials with patient including dysmenorrhea, ovarian cyst, ovarian rupture, ovarian torsion, fibroid and also briefly discussed other abdominal etiologies.  At this time recommendation made to patient due to the severity of her pain recommend further evaluation of emergency room and possible ultrasound imaging.  Patient requests medication that she was previously given in urgent care and ER, however again reiterated with patient due to her report of severe pain concern for other etiology and recommend further evaluation in emergency room at this time.  Offered to call EMS, patient declined.  Patient then reported to nursing staff that she would not be going to the ER at this time, nursing staff reiterated that was our recommendation and patient signed out AMA.  Patient was ambulatory out of exam room. ____________________________________________   FINAL CLINICAL IMPRESSION(S) / ED DIAGNOSES  Final diagnoses:  Lower abdominal pain  Pelvic pain  Dysmenorrhea     ED Discharge Orders    None       Note: This dictation was prepared with Dragon dictation along with smaller phrase  technology. Any transcriptional errors that result from this process are unintentional.         Renford Dills, NP 07/09/19 2137

## 2019-07-09 NOTE — Discharge Instructions (Addendum)
Go directly to the emergency room

## 2019-08-30 ENCOUNTER — Other Ambulatory Visit: Payer: Self-pay

## 2019-08-30 ENCOUNTER — Emergency Department
Admission: EM | Admit: 2019-08-30 | Discharge: 2019-08-30 | Disposition: A | Discharge status: Home / Self Care | Payer: Medicaid Other | Attending: Emergency Medicine | Admitting: Emergency Medicine

## 2019-08-30 ENCOUNTER — Encounter: Payer: Self-pay | Admitting: Emergency Medicine

## 2019-08-30 DIAGNOSIS — R1030 Lower abdominal pain, unspecified: Secondary | ICD-10-CM | POA: Insufficient documentation

## 2019-08-30 DIAGNOSIS — R109 Unspecified abdominal pain: Secondary | ICD-10-CM

## 2019-08-30 MED ORDER — DROPERIDOL 2.5 MG/ML IJ SOLN
2.5000 mg | Freq: Once | INTRAMUSCULAR | Status: AC
  Administered 2019-08-30: 11:00:00 2.5 mg via INTRAVENOUS
  Filled 2019-08-30: qty 2

## 2019-08-30 MED ORDER — SODIUM CHLORIDE 0.9 % IV BOLUS
1000.0000 mL | Freq: Once | INTRAVENOUS | Status: AC
  Administered 2019-08-30: 1000 mL via INTRAVENOUS

## 2019-08-30 NOTE — ED Notes (Signed)
Pt denies clotting, states periods are steady and normal other than the pain.

## 2019-08-30 NOTE — ED Triage Notes (Signed)
Pt here with c/o lower abdominal cramping that began today, states she started her period yesterday and has really bad menstrual cramps about every other month, crying in triage, yelling out that she is in pain.

## 2019-08-30 NOTE — ED Provider Notes (Signed)
[facility] Emergency Department Provider Note  ____________________________________________   I have reviewed the triage vital signs and the nursing notes.   HISTORY  Chief Complaint Abdominal Cramping   History limited by: Not Limited   HPI Alice Lewis is a 24 y.o. female who presents to the emergency department today because of concern for abdominal pain. Patient states she started her period yesterday. This morning started having severe pain in the suprapubic region. Patient has history of severe cramping with menstrual periods. States she has not yet followed up with ob/gyn because she is scared they will want to put her on birth control and she is scared that birth control will kill her. The patient denies any fevers. Tried taking ibuprofen this morning without any significant relief.    Records reviewed. Per medical record review patient has a history of ER visits last year for pelvic pain and lower abdominal pain.  History reviewed. No pertinent past medical history.  There are no problems to display for this patient.   Past Surgical History:  Procedure Laterality Date  . NO PAST SURGERIES      Prior to Admission medications   Medication Sig Start Date End Date Taking? Authorizing Provider  ketorolac (TORADOL) 10 MG tablet Take 1 tablet (10 mg total) by mouth every 6 (six) hours as needed. 04/19/19   Hinda Kehr, MD  promethazine (PHENERGAN) 12.5 MG tablet Take 2 tablets (25 mg total) by mouth every 6 (six) hours as needed for nausea or vomiting. 04/19/19   Hinda Kehr, MD    Allergies Patient has no known allergies.  Family History  Problem Relation Age of Onset  . Stroke Mother   . Healthy Father     Social History Social History   Tobacco Use  . Smoking status: Never Smoker  . Smokeless tobacco: Never Used  Substance Use Topics  . Alcohol use: Never  . Drug use: Not Currently    Review of Systems Constitutional: No  fever/chills Eyes: No visual changes. ENT: No sore throat. Cardiovascular: Denies chest pain. Respiratory: Denies shortness of breath. Gastrointestinal: Positive for abdominal pain.   Genitourinary: Negative for dysuria. Musculoskeletal: Negative for back pain. Skin: Negative for rash. Neurological: Negative for headaches, focal weakness or numbness.  ____________________________________________   PHYSICAL EXAM:  VITAL SIGNS: ED Triage Vitals  Enc Vitals Group     BP 08/30/19 0838 (!) 114/57     Pulse Rate 08/30/19 0838 71     Resp 08/30/19 0838 20     Temp 08/30/19 0838 98.6 F (37 C)     Temp Source 08/30/19 0838 Oral     SpO2 08/30/19 0838 98 %     Weight 08/30/19 0838 142 lb (64.4 kg)     Height 08/30/19 0838 5\' 5"  (1.651 m)     Head Circumference --      Peak Flow --      Pain Score 08/30/19 0841 10   Constitutional: Alert and oriented.  Eyes: Conjunctivae are normal.  ENT      Head: Normocephalic and atraumatic.      Nose: No congestion/rhinnorhea.      Mouth/Throat: Mucous membranes are moist.      Neck: No stridor. Hematological/Lymphatic/Immunilogical: No cervical lymphadenopathy. Cardiovascular: Normal rate, regular rhythm.  No murmurs, rubs, or gallops.  Respiratory: Normal respiratory effort without tachypnea nor retractions. Breath sounds are clear and equal bilaterally. No wheezes/rales/rhonchi. Gastrointestinal: Soft and tender to palpation in the suprapubic region. Genitourinary: Deferred Musculoskeletal: Normal  range of motion in all extremities. No lower extremity edema. Neurologic:  Normal speech and language. No gross focal neurologic deficits are appreciated.  Skin:  Skin is warm, dry and intact. No rash noted. Psychiatric: Mood and affect are normal. Speech and behavior are normal. Patient exhibits appropriate insight and judgment.  ____________________________________________    LABS (pertinent  positives/negatives)  None  ____________________________________________   EKG  None  ____________________________________________    RADIOLOGY  None  ____________________________________________   PROCEDURES  Procedures  ____________________________________________   INITIAL IMPRESSION / ASSESSMENT AND PLAN / ED COURSE  Pertinent labs & imaging results that were available during my care of the patient were reviewed by me and considered in my medical decision making (see chart for details).   Patient presented to the emergency department today because of concerns for abdominal pain.  Pain is located in the suprapubic region.  Patient has had similar pain with recent menstruation.  Patient was given medication here and did have good relief of her pain.  Did discuss with patient portance of following up with ob/gyn. At this time given good relief of pain and relationship to periods have low suspicion for torsion or significant ovarian pathology.    ____________________________________________   FINAL CLINICAL IMPRESSION(S) / ED DIAGNOSES  Final diagnoses:  Abdominal cramping     Note: This dictation was prepared with Dragon dictation. Any transcriptional errors that result from this process are unintentional     Phineas Semen, MD 08/30/19 1156

## 2019-08-30 NOTE — ED Notes (Signed)
Called pharmacy and requested medication to be sent up asap

## 2019-08-30 NOTE — ED Notes (Signed)
Pt informed of needing urine sample when able 

## 2019-08-30 NOTE — Discharge Instructions (Addendum)
Please seek medical attention for any high fevers, chest pain, shortness of breath, change in behavior, persistent vomiting, bloody stool or any other new or concerning symptoms.  

## 2019-08-30 NOTE — ED Notes (Signed)
Pt given 2 warm blankets at this time.  

## 2019-09-23 ENCOUNTER — Emergency Department
Admission: EM | Admit: 2019-09-23 | Discharge: 2019-09-23 | Disposition: A | Discharge status: Home / Self Care | Payer: Medicaid Other | Attending: Emergency Medicine | Admitting: Emergency Medicine

## 2019-09-23 ENCOUNTER — Encounter: Payer: Self-pay | Admitting: Emergency Medicine

## 2019-09-23 DIAGNOSIS — R112 Nausea with vomiting, unspecified: Secondary | ICD-10-CM | POA: Insufficient documentation

## 2019-09-23 DIAGNOSIS — R1030 Lower abdominal pain, unspecified: Secondary | ICD-10-CM | POA: Insufficient documentation

## 2019-09-23 DIAGNOSIS — Z5321 Procedure and treatment not carried out due to patient leaving prior to being seen by health care provider: Secondary | ICD-10-CM | POA: Insufficient documentation

## 2019-09-23 LAB — COMPREHENSIVE METABOLIC PANEL
ALT: 11 U/L (ref 0–44)
AST: 19 U/L (ref 15–41)
Albumin: 4.2 g/dL (ref 3.5–5.0)
Alkaline Phosphatase: 64 U/L (ref 38–126)
Anion gap: 11 (ref 5–15)
BUN: 13 mg/dL (ref 6–20)
CO2: 21 mmol/L — ABNORMAL LOW (ref 22–32)
Calcium: 9.1 mg/dL (ref 8.9–10.3)
Chloride: 108 mmol/L (ref 98–111)
Creatinine, Ser: 0.71 mg/dL (ref 0.44–1.00)
GFR calc Af Amer: >60 mL/min (ref >60)
GFR calc non Af Amer: >60 mL/min (ref >60)
Glucose, Bld: 134 mg/dL — ABNORMAL HIGH (ref 70–99)
Potassium: 3.2 mmol/L — ABNORMAL LOW (ref 3.5–5.1)
Sodium: 140 mmol/L (ref 135–145)
Total Bilirubin: 0.9 mg/dL (ref 0.3–1.2)
Total Protein: 7.4 g/dL (ref 6.5–8.1)

## 2019-09-23 LAB — CBC
HCT: 35.7 % — ABNORMAL LOW (ref 36.0–46.0)
Hemoglobin: 11 g/dL — ABNORMAL LOW (ref 12.0–15.0)
MCH: 24.3 pg — ABNORMAL LOW (ref 26.0–34.0)
MCHC: 30.8 g/dL (ref 30.0–36.0)
MCV: 78.8 fL — ABNORMAL LOW (ref 80.0–100.0)
Platelets: 330 10*3/uL (ref 150–400)
RBC: 4.53 MIL/uL (ref 3.87–5.11)
RDW: 17.4 % — ABNORMAL HIGH (ref 11.5–15.5)
WBC: 7.5 10*3/uL (ref 4.0–10.5)
nRBC: 0 % (ref 0.0–0.2)

## 2019-09-23 LAB — LIPASE, BLOOD: Lipase: 22 U/L (ref 11–51)

## 2019-09-23 NOTE — ED Triage Notes (Signed)
Pt c/o lower abdominal pain, N/V that happens every month for "years." Pt reports she was seen last month for same. Pt admits to using mariajuana daily for years. Pt denies urinary symptoms.

## 2020-08-10 ENCOUNTER — Other Ambulatory Visit: Payer: Self-pay

## 2020-08-10 ENCOUNTER — Ambulatory Visit
Admission: EM | Admit: 2020-08-10 | Discharge: 2020-08-10 | Disposition: A | Discharge status: Home / Self Care | Payer: HRSA Program | Attending: Sports Medicine | Admitting: Sports Medicine

## 2020-08-10 DIAGNOSIS — Z20822 Contact with and (suspected) exposure to covid-19: Secondary | ICD-10-CM | POA: Diagnosis present

## 2020-08-10 DIAGNOSIS — U071 COVID-19: Secondary | ICD-10-CM | POA: Diagnosis not present

## 2020-08-10 LAB — SARS CORONAVIRUS 2 (TAT 6-24 HRS): SARS Coronavirus 2: POSITIVE — AB

## 2020-08-10 NOTE — Discharge Instructions (Signed)

## 2020-08-10 NOTE — ED Triage Notes (Signed)
Patient states that she was exposed to covid and would like to be tested, currently without symptoms.

## 2021-01-22 ENCOUNTER — Ambulatory Visit
Admission: EM | Admit: 2021-01-22 | Discharge: 2021-01-22 | Disposition: A | Discharge status: Home / Self Care | Payer: Self-pay

## 2021-01-22 ENCOUNTER — Other Ambulatory Visit: Payer: Self-pay

## 2021-01-22 DIAGNOSIS — S2341XA Sprain of ribs, initial encounter: Secondary | ICD-10-CM

## 2021-01-22 MED ORDER — BACLOFEN 10 MG PO TABS: 10.0000 mg | ORAL_TABLET | Freq: Three times a day (TID) | ORAL | 0 refills | Status: DC

## 2021-01-22 MED ORDER — IBUPROFEN 600 MG PO TABS: 600.0000 mg | ORAL_TABLET | Freq: Four times a day (QID) | ORAL | 0 refills | Status: DC | PRN

## 2021-01-22 NOTE — Discharge Instructions (Addendum)
Take the Ibuprofen, 600 mg every 6 hours with food for pain and inflammation.  Take the Baclofen, 10 mg every 8 hours as needed for muscle spasm.  Apply ice or moist heat to the painful area for 20 minutes at a time, 2-03 times a day. Do not apply eithe rdirectly to your skin- put a cloth or towel in between.  Rest.  Return for new or worsening symptoms.

## 2021-01-22 NOTE — ED Provider Notes (Signed)
[facility]    CSN: [id number] Arrival date & time: 01/22/21  4196      History   Chief Complaint Chief Complaint  Patient presents with   Rib Pain (Right)    HPI Carrieann Spielberg is a 25 y.o. female.   HPI  25 year old female here for evaluation of right rib pain.  Patient reports that the pain started when she woke up yesterday.  She does not member any injury and has not been coughing or sneezing.  She denies any shortness of breath or previous injury to her rib cage.  She states that it does hurt to take a deep breath but not with normal respirations.  The pain also increases with rotation or lateral flexion.  She does heavy lifting at work but cannot remember a precipitating event.  History reviewed. No pertinent past medical history.  There are no problems to display for this patient.   Past Surgical History:  Procedure Laterality Date   NO PAST SURGERIES      OB History   No obstetric history on file.      Home Medications    Prior to Admission medications   Medication Sig Start Date End Date Taking? Authorizing Provider  baclofen (LIORESAL) 10 MG tablet Take 1 tablet (10 mg total) by mouth 3 (three) times daily. 01/22/21  Yes Becky Augusta, NP  ibuprofen (ADVIL) 600 MG tablet Take 1 tablet (600 mg total) by mouth every 6 (six) hours as needed. 01/22/21  Yes Becky Augusta, NP  SPRINTEC 28 0.25-35 MG-MCG tablet Take 1 tablet by mouth daily. 11/15/20  Yes [provider]    Family History Family History  Problem Relation Age of Onset   Stroke Mother    Healthy Father     Social History Social History   Tobacco Use   Smoking status: Never   Smokeless tobacco: Never  Vaping Use   Vaping Use: Never used  Substance Use Topics   Alcohol use: Yes   Drug use: Yes    Types: Marijuana     Allergies   Patient has no known allergies.   Review of Systems Review of Systems  Constitutional:  Negative for fever.  Respiratory:   Negative for cough and shortness of breath.   Cardiovascular:  Positive for chest pain.  Skin:  Negative for color change.    Physical Exam Triage Vital Signs ED Triage Vitals  Enc Vitals Group     BP 01/22/21 0908 98/63     Pulse Rate 01/22/21 0908 88     Resp 01/22/21 0908 18     Temp 01/22/21 0908 98.2 F (36.8 C)     Temp Source 01/22/21 0908 Oral     SpO2 01/22/21 0908 100 %     Weight 01/22/21 0907 136 lb (61.7 kg)     Height 01/22/21 0907 5\' 9"  (1.753 m)     Head Circumference --      Peak Flow --      Pain Score 01/22/21 0907 8     Pain Loc --      Pain Edu? --      Excl. in GC? --    No data found.  Updated Vital Signs BP 98/63 (BP Location: Right Arm)   Pulse 88   Temp 98.2 F (36.8 C) (Oral)   Resp 18   Ht 5\' 9"  (1.753 m)   Wt 136 lb (61.7 kg)   SpO2 100%   BMI 20.08 kg/m  Visual Acuity Right Eye Distance:   Left Eye Distance:   Bilateral Distance:    Right Eye Near:   Left Eye Near:    Bilateral Near:     Physical Exam Vitals and nursing note reviewed.  Constitutional:      General: She is not in acute distress.    Appearance: Normal appearance. She is normal weight. She is not ill-appearing.  HENT:     Head: Normocephalic and atraumatic.  Cardiovascular:     Rate and Rhythm: Normal rate and regular rhythm.     Pulses: Normal pulses.     Heart sounds: Normal heart sounds. No murmur heard.   No gallop.  Pulmonary:     Effort: Pulmonary effort is normal.     Breath sounds: Normal breath sounds. No wheezing, rhonchi or rales.  Chest:     Chest wall: Tenderness present.  Skin:    General: Skin is warm and dry.     Capillary Refill: Capillary refill takes less than 2 seconds.     Findings: No bruising or erythema.  Neurological:     General: No focal deficit present.     Mental Status: She is alert and oriented to person, place, and time.  Psychiatric:        Mood and Affect: Mood normal.        Behavior: Behavior normal.         Thought Content: Thought content normal.        Judgment: Judgment normal.     UC Treatments / Results  Labs (all labs ordered are listed, but only abnormal results are displayed) Labs Reviewed - No data to display  EKG   Radiology No results found.  Procedures Procedures (including critical care time)  Medications Ordered in UC Medications - No data to display  Initial Impression / Assessment and Plan / UC Course  I have reviewed the triage vital signs and the nursing notes.  Pertinent labs & imaging results that were available during my care of the patient were reviewed by me and considered in my medical decision making (see chart for details).  Patient is a nontoxic-appearing 25 year old female here for evaluation of rib pain as outlined HPI above.  Patient's physical exam reveals normal AP diameter of the patient's chest.  Visual inspection of the right lateral chest wall where patient Isabella Stalling there is pain does not reveal erythema or ecchymosis.  Patient does have tenderness with palpation of the mid axillary line of her right rib cage from her third rib through her sixth rib.  There is no crepitus appreciated on exam.  Cardiopulmonary exam is benign.  Patient exam is consistent with a rib strain.  We will treat patient with NSAIDs, baclofen, ice or moist heat, and rest.  Work note provided.   Final Clinical Impressions(s) / UC Diagnoses   Final diagnoses:  Sprain of costal cartilage, initial encounter     Discharge Instructions      Take the Ibuprofen, 600 mg every 6 hours with food for pain and inflammation.  Take the Baclofen, 10 mg every 8 hours as needed for muscle spasm.  Apply ice or moist heat to the painful area for 20 minutes at a time, 2-03 times a day. Do not apply eithe rdirectly to your skin- put a cloth or towel in between.  Rest.  Return for new or worsening symptoms.      ED Prescriptions     Medication Sig Dispense Auth. Provider  ibuprofen (ADVIL) 600 MG tablet Take 1 tablet (600 mg total) by mouth every 6 (six) hours as needed. 30 tablet Becky Augusta, NP   baclofen (LIORESAL) 10 MG tablet Take 1 tablet (10 mg total) by mouth 3 (three) times daily. 30 each Becky Augusta, NP      PDMP not reviewed this encounter.   Becky Augusta, NP 01/22/21 [phone]

## 2021-01-22 NOTE — ED Triage Notes (Signed)
Patient complains of right rib pain that started yesterday and worsens with movement. States that bending or taking a deep breath makes it worse.

## 2021-03-28 ENCOUNTER — Ambulatory Visit
Admission: EM | Admit: 2021-03-28 | Discharge: 2021-03-28 | Disposition: A | Discharge status: Home / Self Care | Payer: Self-pay | Attending: Physician Assistant | Admitting: Physician Assistant

## 2021-03-28 ENCOUNTER — Encounter: Payer: Self-pay | Admitting: Emergency Medicine

## 2021-03-28 ENCOUNTER — Other Ambulatory Visit: Payer: Self-pay

## 2021-03-28 DIAGNOSIS — N946 Dysmenorrhea, unspecified: Secondary | ICD-10-CM | POA: Insufficient documentation

## 2021-03-28 DIAGNOSIS — R102 Pelvic and perineal pain: Secondary | ICD-10-CM | POA: Insufficient documentation

## 2021-03-28 LAB — URINALYSIS, COMPLETE (UACMP) WITH MICROSCOPIC
Bilirubin Urine: NEGATIVE
Glucose, UA: NEGATIVE mg/dL
Ketones, ur: NEGATIVE mg/dL
Leukocytes,Ua: NEGATIVE
Nitrite: NEGATIVE
Protein, ur: 30 mg/dL — AB
Specific Gravity, Urine: 1.02 (ref 1.005–1.030)
pH: 8 (ref 5.0–8.0)

## 2021-03-28 LAB — PREGNANCY, URINE: Preg Test, Ur: NEGATIVE

## 2021-03-28 MED ORDER — KETOROLAC TROMETHAMINE 60 MG/2ML IM SOLN
60.0000 mg | Freq: Once | INTRAMUSCULAR | Status: AC
  Administered 2021-03-28: 60 mg via INTRAMUSCULAR

## 2021-03-28 MED ORDER — KETOROLAC TROMETHAMINE 10 MG PO TABS: 10.0000 mg | ORAL_TABLET | Freq: Four times a day (QID) | ORAL | 0 refills | Status: DC | PRN

## 2021-03-28 MED ORDER — SPRINTEC 28 0.25-35 MG-MCG PO TABS: 1.0000 | ORAL_TABLET | Freq: Every day | ORAL | 5 refills | Status: DC

## 2021-03-28 NOTE — Discharge Instructions (Signed)
-  You have had significant relief with the Toradol shot that you were given in the clinic today.  I have prescribed this in pill form for her to take if needed.  I also refilled your birth control for 6 months.  You may be able to get refills of this medication online or through the pharmacy without a prescription from your doctor.  Try not to run out of this medication again or you can have another severe.  Like this.  Make sure to rest and increase fluid intake.  You can take Tylenol also if you need to for more pain.  If any symptoms acutely worsen you should be seen again.  Go to ED for any worsening of symptoms.

## 2021-03-28 NOTE — ED Provider Notes (Signed)
[facility]    CSN: [id number] Arrival date & time: 03/28/21  1141      History   Chief Complaint Chief Complaint  Patient presents with   Abdominal Pain    HPI Alice Lewis is a 25 y.o. female presenting with severe abdominal/pelvic pain that began this morning.  It is associate with nausea and vomiting.  Patient admits to starting her menstrual period yesterday and she states that she has a history of severe menstrual cramps.  She was previously on oral contraceptives but says that she ran out last month.  Patient has had to the emergency department on multiple occasions for dysmenorrhea.  She says it has been a long time since she is gone to the emergency department for dysmenorrhea though since her symptoms were controlled on oral contraceptives.  Patient denies any concern for pregnancy as she is not sexually active and also says that she is homosexual.  Denies any female partners.  She denies any vaginal discharge and no dysuria or back pain.  Patient has not taken any medication to help with pain relief today.  She denies any changes in her bowel habits and denies any fever, cough or congestion.  Patient says she has not really had work-up as to why she has dysmenorrhea since the oral contraceptives help.  No other complaints.  HPI  History reviewed. No pertinent past medical history.  There are no problems to display for this patient.   Past Surgical History:  Procedure Laterality Date   NO PAST SURGERIES      OB History   No obstetric history on file.      Home Medications    Prior to Admission medications   Medication Sig Start Date End Date Taking? Authorizing Provider  ketorolac (TORADOL) 10 MG tablet Take 1 tablet (10 mg total) by mouth every 6 (six) hours as needed for moderate pain or severe pain. 03/28/21  Yes Shirlee Latch, PA-C  SPRINTEC 28 0.25-35 MG-MCG tablet Take 1 tablet by mouth daily for 28 days. 03/28/21 04/25/21  Shirlee Latch, PA-C     Family History Family History  Problem Relation Age of Onset   Stroke Mother    Healthy Father     Social History Social History   Tobacco Use   Smoking status: Every Day    Types: Cigarettes   Smokeless tobacco: Never  Vaping Use   Vaping Use: Never used  Substance Use Topics   Alcohol use: Yes   Drug use: Yes    Types: Marijuana     Allergies   Patient has no known allergies.   Review of Systems Review of Systems  Constitutional:  Negative for fatigue and fever.  Respiratory:  Negative for shortness of breath.   Cardiovascular:  Negative for chest pain.  Gastrointestinal:  Positive for abdominal pain, nausea and vomiting. Negative for constipation and diarrhea.  Genitourinary:  Positive for pelvic pain and vaginal bleeding. Negative for dysuria, flank pain, vaginal discharge and vaginal pain.  Musculoskeletal:  Negative for back pain.  Neurological:  Negative for weakness.    Physical Exam Triage Vital Signs ED Triage Vitals  Enc Vitals Group     BP 03/28/21 1153 104/69     Pulse Rate 03/28/21 1153 82     Resp 03/28/21 1153 20     Temp 03/28/21 1153 98.4 F (36.9 C)     Temp Source 03/28/21 1153 Oral     SpO2 03/28/21 1153 98 %  Weight 03/28/21 1151 136 lb 0.4 oz (61.7 kg)     Height 03/28/21 1151 5\' 9"  (1.753 m)     Head Circumference --      Peak Flow --      Pain Score 03/28/21 1150 10     Pain Loc --      Pain Edu? --      Excl. in GC? --    No data found.  Updated Vital Signs BP 104/69 (BP Location: Right Arm)   Pulse 82   Temp 98.4 F (36.9 C) (Oral)   Resp 20   Ht 5\' 9"  (1.753 m)   Wt 136 lb 0.4 oz (61.7 kg)   LMP 03/27/2021 (Exact Date)   SpO2 98%   BMI 20.09 kg/m     Physical Exam Vitals and nursing note reviewed.  Constitutional:      General: She is in acute distress (due to pain. She is crying and in the fetal position on the exam table).     Appearance: Normal appearance. She is not ill-appearing or toxic-appearing.   HENT:     Head: Normocephalic and atraumatic.     Nose: Nose normal.     Mouth/Throat:     Mouth: Mucous membranes are moist.     Pharynx: Oropharynx is clear.  Eyes:     General: No scleral icterus.       Right eye: No discharge.        Left eye: No discharge.     Conjunctiva/sclera: Conjunctivae normal.  Cardiovascular:     Rate and Rhythm: Normal rate and regular rhythm.     Heart sounds: Normal heart sounds.  Pulmonary:     Effort: Pulmonary effort is normal. No respiratory distress.     Breath sounds: Normal breath sounds.  Abdominal:     Palpations: Abdomen is soft.     Tenderness: There is abdominal tenderness in the suprapubic area. There is guarding.  Musculoskeletal:     Cervical back: Neck supple.  Skin:    General: Skin is dry.  Neurological:     General: No focal deficit present.     Mental Status: She is alert. Mental status is at baseline.     Motor: No weakness.     Gait: Gait normal.  Psychiatric:        Mood and Affect: Mood normal.        Behavior: Behavior normal.        Thought Content: Thought content normal.     UC Treatments / Results  Labs (all labs ordered are listed, but only abnormal results are displayed) Labs Reviewed  URINALYSIS, COMPLETE (UACMP) WITH MICROSCOPIC - Abnormal; Notable for the following components:      Result Value   Color, Urine AMBER (*)    Hgb urine dipstick LARGE (*)    Protein, ur 30 (*)    Bacteria, UA MANY (*)    All other components within normal limits  PREGNANCY, URINE    EKG   Radiology No results found.  Procedures Procedures (including critical care time)  Medications Ordered in UC Medications  ketorolac (TORADOL) injection 60 mg (60 mg Intramuscular Given 03/28/21 1237)    Initial Impression / Assessment and Plan / UC Course  I have reviewed the triage vital signs and the nursing notes.  Pertinent labs & imaging results that were available during my care of the patient were reviewed by me  and considered in my medical decision making (see chart for  details).  25 year old female presenting with lower abdominal pain/pelvic pain and dysmenorrhea since this morning.  Menstrual period started yesterday.  Patient reports long history of dysmenorrhea since she had her very first period.  Symptoms had been controlled well on Sprintec which she has been taking for the past year and has not had any periods.  Patient ran out of this medication last month and this is the first.  She has had since.  She has reported 10 out of 10 pain.  In the clinic she is tearful and in obvious pain/distress.  She is also curled up in the fetal position on the exam bed.  She had 1 episode of vomiting as well.  Patient's vital signs are all normal and stable.  She does have tenderness to palpation of the lower abdomen especially the suprapubic region.  The remainder the exam is normal.  GU exam deferred at this time.  Patient says she is not sexually active and is homosexual.  Denies any concern or possibility of pregnancy or STIs.  Urine pregnancy negative.  Urinalysis does not show any evidence of UTI.  Patient given 60 mg IM ketorolac and monitored over an hour.  Pain went from 10 and a 10 to 1 out of 10 after the Toradol injection.  Patient also was in the exam room using her cell phone and no longer tearful.  She appeared comfortable when I checked on her.  I have sent in more ketorolac for her and printed a coupon for good Rx.  Additionally I refilled her Sprintec.  Reviewed ED precautions with patient.   Final Clinical Impressions(s) / UC Diagnoses   Final diagnoses:  Dysmenorrhea  Pelvic pain     Discharge Instructions      -You have had significant relief with the Toradol shot that you were given in the clinic today.  I have prescribed this in pill form for her to take if needed.  I also refilled your birth control for 6 months.  You may be able to get refills of this medication online or through the  pharmacy without a prescription from your doctor.  Try not to run out of this medication again or you can have another severe.  Like this.  Make sure to rest and increase fluid intake.  You can take Tylenol also if you need to for more pain.  If any symptoms acutely worsen you should be seen again.  Go to ED for any worsening of symptoms.     ED Prescriptions     Medication Sig Dispense Auth. Provider   SPRINTEC 28 0.25-35 MG-MCG tablet Take 1 tablet by mouth daily for 28 days. 28 tablet Eusebio Friendly B, PA-C   ketorolac (TORADOL) 10 MG tablet Take 1 tablet (10 mg total) by mouth every 6 (six) hours as needed for moderate pain or severe pain. 20 tablet Gareth Morgan      PDMP not reviewed this encounter.   Shirlee Latch, PA-C 03/28/21 1344

## 2021-03-28 NOTE — ED Triage Notes (Signed)
Pt c/o lower abdominal pain, nausea. Started this morning. Pt is doubles over and crying in triage. She states it feels like menstrual cramps. She states she used to get bad menstrual cramps but has not had cramps like this in a long time. She stated her menstrual cycle yesterday.

## 2021-08-11 ENCOUNTER — Ambulatory Visit
Admission: EM | Admit: 2021-08-11 | Discharge: 2021-08-11 | Disposition: A | Discharge status: Home / Self Care | Payer: Medicaid Other | Attending: Emergency Medicine | Admitting: Emergency Medicine

## 2021-08-11 ENCOUNTER — Other Ambulatory Visit: Payer: Self-pay

## 2021-08-11 ENCOUNTER — Encounter: Payer: Self-pay | Admitting: Emergency Medicine

## 2021-08-11 DIAGNOSIS — K0889 Other specified disorders of teeth and supporting structures: Secondary | ICD-10-CM

## 2021-08-11 DIAGNOSIS — K047 Periapical abscess without sinus: Secondary | ICD-10-CM

## 2021-08-11 MED ORDER — TRAMADOL HCL 50 MG PO TABS: 50.0000 mg | ORAL_TABLET | Freq: Four times a day (QID) | ORAL | 0 refills | Status: AC | PRN

## 2021-08-11 MED ORDER — LIDOCAINE VISCOUS HCL 2 % MT SOLN: 15.0000 mL | OROMUCOSAL | 0 refills | Status: DC | PRN

## 2021-08-11 MED ORDER — KETOROLAC TROMETHAMINE 60 MG/2ML IM SOLN
30.0000 mg | Freq: Once | INTRAMUSCULAR | Status: AC
  Administered 2021-08-11: 30 mg via INTRAMUSCULAR

## 2021-08-11 MED ORDER — AMOXICILLIN-POT CLAVULANATE 875-125 MG PO TABS: 1.0000 | ORAL_TABLET | Freq: Two times a day (BID) | ORAL | 0 refills | Status: DC

## 2021-08-11 NOTE — Discharge Instructions (Addendum)
There appears to be an infection on your lower gumline  Take Augmentin twice a day for the next 7 days  You may continue use of all of the treatments that you have tried so far in addition to what has been added today  You may gargle and spit lidocaine solution every 4 hours as needed which will give a temporary numbing effect  You may use tramadol every 6 hours as needed to help with pain, use sparingly as you have only been dispensed 8 tablets  Inside your packet is a list of local dental resources, please reach out to try to get an appointment as they have a dentist is the only way you can help with your tooth

## 2021-08-11 NOTE — ED Provider Notes (Signed)
[facility]    CSN: [id number] Arrival date & time: 08/11/21  1354      History   Chief Complaint Chief Complaint  Patient presents with   Dental Pain    HPI Alice Lewis is a 26 y.o. female.   Patient presents with dental pain along the right lower gumline beginning this morning.  Pain has begun to radiate into right side of neck.  Interfering with sleep.  Endorses that she is in need of dental work and there is tooth present with whole in it as well as a tooth that is partially missing.  Has attempted use of Tylenol, ibuprofen, salt water gargles, Orajel, Chloraseptic spray with no relief.  Does not have dentist.   History reviewed. No pertinent past medical history.  There are no problems to display for this patient.   Past Surgical History:  Procedure Laterality Date   NO PAST SURGERIES      OB History   No obstetric history on file.      Home Medications    Prior to Admission medications   Medication Sig Start Date End Date Taking? Authorizing Provider  SPRINTEC 28 0.25-35 MG-MCG tablet Take 1 tablet by mouth daily for 28 days. 03/28/21 08/11/21 Yes Eusebio Friendly B, PA-C  ketorolac (TORADOL) 10 MG tablet Take 1 tablet (10 mg total) by mouth every 6 (six) hours as needed for moderate pain or severe pain. 03/28/21   Shirlee Latch, PA-C    Family History Family History  Problem Relation Age of Onset   Stroke Mother    Healthy Father     Social History Social History   Tobacco Use   Smoking status: Every Day    Types: Cigarettes, Cigars   Smokeless tobacco: Never  Vaping Use   Vaping Use: Every day  Substance Use Topics   Alcohol use: Yes   Drug use: Yes    Types: Marijuana     Allergies   Patient has no known allergies.   Review of Systems Review of Systems  Constitutional: Negative.   HENT:  Positive for dental problem. Negative for congestion, drooling, ear discharge, ear pain, facial swelling, hearing loss, mouth sores,  nosebleeds, postnasal drip, rhinorrhea, sinus pressure, sinus pain, sneezing, sore throat, tinnitus, trouble swallowing and voice change.   Respiratory: Negative.    Cardiovascular: Negative.   Skin: Negative.   Neurological: Negative.     Physical Exam Triage Vital Signs ED Triage Vitals  Enc Vitals Group     BP 08/11/21 1405 113/86     Pulse Rate 08/11/21 1405 89     Resp 08/11/21 1405 18     Temp 08/11/21 1405 99 F (37.2 C)     Temp Source 08/11/21 1405 Oral     SpO2 08/11/21 1405 99 %     Weight 08/11/21 1402 136 lb 0.4 oz (61.7 kg)     Height 08/11/21 1402 5\' 9"  (1.753 m)     Head Circumference --      Peak Flow --      Pain Score 08/11/21 1402 10     Pain Loc --      Pain Edu? --      Excl. in GC? --    No data found.  Updated Vital Signs BP 113/86 (BP Location: Left Arm)    Pulse 89    Temp 99 F (37.2 C) (Oral)    Resp 18    Ht 5\' 9"  (1.753 m)  Wt 136 lb 0.4 oz (61.7 kg)    SpO2 99%    BMI 20.09 kg/m   Visual Acuity Right Eye Distance:   Left Eye Distance:   Bilateral Distance:    Right Eye Near:   Left Eye Near:    Bilateral Near:     Physical Exam Constitutional:      Appearance: Normal appearance.  HENT:     Mouth/Throat:     Pharynx: Oropharynx is clear. Uvula midline.     Comments: Dental abscess present along the right lower gumline, dental decay of multiple teeth, partial tooth present, moderate gingival swelling, no drainage noted Eyes:     Extraocular Movements: Extraocular movements intact.  Pulmonary:     Effort: Pulmonary effort is normal.  Skin:    General: Skin is warm and dry.  Neurological:     Mental Status: She is alert and oriented to person, place, and time. Mental status is at baseline.  Psychiatric:        Mood and Affect: Mood normal.        Behavior: Behavior normal.     UC Treatments / Results  Labs (all labs ordered are listed, but only abnormal results are displayed) Labs Reviewed - No data to  display  EKG   Radiology No results found.  Procedures Procedures (including critical care time)  Medications Ordered in UC Medications - No data to display  Initial Impression / Assessment and Plan / UC Course  I have reviewed the triage vital signs and the nursing notes.  Pertinent labs & imaging results that were available during my care of the patient were reviewed by me and considered in my medical decision making (see chart for details).  Dental abscess Dental pain  Discussed etiology of symptoms with patient, Augmentin 7-day course prescribed prescribed viscous lidocaine and tramadol 50 mg every 6 hours for further management, 8 tablets dispensed, PDMP reviewed, low risk, Toradol injection given today in office to help minimize pain, given written handout of local dental resources for follow-up Final Clinical Impressions(s) / UC Diagnoses   Final diagnoses:  None   Discharge Instructions   None    ED Prescriptions   None    PDMP not reviewed this encounter.   Valinda Hoar, NP 08/11/21 1424

## 2021-08-11 NOTE — ED Triage Notes (Signed)
Pt c/o right bottom dental pain. Started early this morning. She has tried warm salt water, tylenol and oragel with out relief.

## 2021-10-12 ENCOUNTER — Ambulatory Visit (INDEPENDENT_AMBULATORY_CARE_PROVIDER_SITE_OTHER): Payer: Self-pay

## 2021-10-12 ENCOUNTER — Ambulatory Visit
Admission: EM | Admit: 2021-10-12 | Discharge: 2021-10-12 | Disposition: A | Discharge status: Home / Self Care | Payer: Self-pay

## 2021-10-12 ENCOUNTER — Other Ambulatory Visit: Payer: Self-pay

## 2021-10-12 DIAGNOSIS — M25561 Pain in right knee: Secondary | ICD-10-CM

## 2021-10-12 DIAGNOSIS — S83411A Sprain of medial collateral ligament of right knee, initial encounter: Secondary | ICD-10-CM

## 2021-10-12 MED ORDER — DICLOFENAC SODIUM 75 MG PO TBEC: 75.0000 mg | DELAYED_RELEASE_TABLET | Freq: Two times a day (BID) | ORAL | 0 refills | Status: DC | PRN

## 2021-10-12 NOTE — ED Provider Notes (Signed)
?[facility] ? ? ? ?CSN: [id number] ?Arrival date & time: 10/12/21  0910 ? ? ?  ? ?History   ?Chief Complaint ?Chief Complaint  ?Patient presents with  ? Knee Pain  ?  Right  ? ? ?HPI ?Alice Lewis is a 26 y.o. female.  ? ?26 year old female presents with injury to her right knee. She was playing soccer yesterday when she kicked the soccer ball and felt something "pop" in her right knee. She was able to bend her knee afterwards but minimal weight bearing. Today, her right knee is more painful, especially in the medial area, and she is having difficulty extending her knee. She is not able to put full weight on her right knee/leg. She has not applied any ice to the area or taken any medication for pain or symptoms. She is concerned since she previous injured her right knee about 6 years ago- she tore a ligament in her knee (?posterior) and had to have surgery in [place] for repair. Otherwise no other chronic health issues. Takes no daily medication.  ? ?The history is provided by the patient.  ? ?History reviewed. No pertinent past medical history. ? ?There are no problems to display for this patient. ? ? ?Past Surgical History:  ?Procedure Laterality Date  ? NO PAST SURGERIES    ? ? ?OB History   ?No obstetric history on file. ?  ? ? ? ?Home Medications   ? ?Prior to Admission medications   ?Medication Sig Start Date End Date Taking? Authorizing Provider  ?diclofenac (VOLTAREN) 75 MG EC tablet Take 1 tablet (75 mg total) by mouth every 12 (twelve) hours as needed for moderate pain. 10/12/21  Yes Mikail Goostree, Ali LoweAnn Berry, NP  ?norgestimate-ethinyl estradiol (ORTHO-CYCLEN) 0.25-35 MG-MCG tablet Take 1 tablet by mouth daily. 07/25/21  Yes [provider]  ? ? ?Family History ?Family History  ?Problem Relation Age of Onset  ? Stroke Mother   ? Healthy Father   ? ? ?Social History ?Social History  ? ?Tobacco Use  ? Smoking status: Some Days  ?  Types: Cigarettes, Cigars  ? Smokeless tobacco: Never  ?Vaping  Use  ? Vaping Use: Some days  ?Substance Use Topics  ? Alcohol use: Yes  ?  Comment: Occ.  ? Drug use: Yes  ?  Types: Marijuana  ? ? ? ?Allergies   ?Patient has no known allergies. ? ? ?Review of Systems ?Review of Systems  ?Constitutional:  Positive for activity change. Negative for chills and fever.  ?Musculoskeletal:  Positive for arthralgias, gait problem, joint swelling and myalgias.  ?Skin:  Negative for color change, rash and wound.  ?Allergic/Immunologic: Negative for environmental allergies, food allergies and immunocompromised state.  ?Neurological:  Negative for tremors, seizures, syncope, weakness, numbness and headaches.  ?Hematological:  Negative for adenopathy. Does not bruise/bleed easily.  ? ? ?Physical Exam ?Triage Vital Signs ?ED Triage Vitals  ?Enc Vitals Group  ?   BP 10/12/21 0936 115/68  ?   Pulse Rate 10/12/21 0936 80  ?   Resp 10/12/21 0936 18  ?   Temp 10/12/21 0936 98.2 ?F (36.8 ?C)  ?   Temp Source 10/12/21 0936 Oral  ?   SpO2 10/12/21 0936 97 %  ?   Weight 10/12/21 0935 148 lb (67.1 kg)  ?   Height 10/12/21 0935 5\' 5"  (1.651 m)  ?   Head Circumference --   ?   Peak Flow --   ?   Pain  Score 10/12/21 0932 8  ?   Pain Loc --   ?   Pain Edu? --   ?   Excl. in GC? --   ? ?No data found. ? ?Updated Vital Signs ?BP 115/68 (BP Location: Right Arm)   Pulse 80   Temp 98.2 ?F (36.8 ?C) (Oral)   Resp 18   Ht 5\' 5"  (1.651 m)   Wt 148 lb (67.1 kg)   LMP  (LMP Unknown)   SpO2 97%   BMI 24.63 kg/m?  ? ?Visual Acuity ?Right Eye Distance:   ?Left Eye Distance:   ?Bilateral Distance:   ? ?Right Eye Near:   ?Left Eye Near:    ?Bilateral Near:    ? ?Physical Exam ?Vitals and nursing note reviewed.  ?Constitutional:   ?   General: She is awake. She is not in acute distress. ?   Appearance: She is well-developed and well-groomed.  ?   Comments: She is sitting on the exam table in no acute distress but appears in pain and finds letting her right knee dangle at 90 degrees is most comfortable.   ?HENT:  ?    Head: Normocephalic and atraumatic.  ?   Right Ear: Hearing normal.  ?   Left Ear: Hearing normal.  ?Eyes:  ?   Extraocular Movements: Extraocular movements intact.  ?   Conjunctiva/sclera: Conjunctivae normal.  ?Cardiovascular:  ?   Rate and Rhythm: Normal rate.  ?Pulmonary:  ?   Effort: Pulmonary effort is normal.  ?Musculoskeletal:     ?   General: Swelling and tenderness present.  ?   Cervical back: Normal range of motion.  ?   Right knee: Swelling present. No effusion, erythema, ecchymosis, lacerations, bony tenderness or crepitus. Decreased range of motion. Tenderness present over the medial joint line. Normal alignment and normal patellar mobility. Normal pulse.  ?   Left knee: Normal.  ?     Legs: ? ?   Comments: Has decreased range of motion of right knee, especially with full extension. Tender and some swelling present along the medial aspect of the patella and along medial ligaments. No redness or abrasion present. No distinct neuro deficits. Good distal pulses.   ?Skin: ?   General: Skin is warm and dry.  ?   Findings: No abrasion, bruising, ecchymosis, erythema, laceration or rash.  ?Neurological:  ?   General: No focal deficit present.  ?   Mental Status: She is alert and oriented to person, place, and time.  ?   Sensory: Sensation is intact. No sensory deficit.  ?   Motor: Motor function is intact.  ?   Gait: Gait abnormal.  ?   Comments: Unable to completely bear weight on her right knee.   ?Psychiatric:     ?   Mood and Affect: Mood normal.     ?   Behavior: Behavior normal. Behavior is cooperative.     ?   Thought Content: Thought content normal.     ?   Judgment: Judgment normal.  ? ? ? ?UC Treatments / Results  ?Labs ?(all labs ordered are listed, but only abnormal results are displayed) ?Labs Reviewed - No data to display ? ?EKG ? ? ?Radiology ?DG Knee Complete 4 Views Right ? ?Result Date: 10/12/2021 ?CLINICAL DATA:  26 year old female with knee pain. EXAM: RIGHT KNEE - COMPLETE 4+ VIEW  COMPARISON:  None. FINDINGS: No evidence of fracture, dislocation, or joint effusion. No evidence of arthropathy or other focal bone  abnormality. Soft tissues are unremarkable. IMPRESSION: No acute fracture or malalignment. Electronically Signed   By: Marliss Coots M.D.   On: 10/12/2021 10:03   ? ?Procedures ?Procedures (including critical care time) ? ?Medications Ordered in UC ?Medications - No data to display ? ?Initial Impression / Assessment and Plan / UC Course  ?I have reviewed the triage vital signs and the nursing notes. ? ?Pertinent labs & imaging results that were available during my care of the patient were reviewed by me and considered in my medical decision making (see chart for details). ? ?  ? ?Reviewed negative x-ray results with patient. Discussed that x-ray will only pick up fractures and dislocations but will not determine ligament or tendon damage. May need MRI for further evaluation.  ?Recommend wear right knee brace for support. Continue to keep right knee/leg elevated as much as possible for support and to decrease swelling. May apply ice every 3 to 4 hours for the next 24 hours. Recommend start Voltaren 75mg  every 12 hours as needed for pain. Provided information on [facility] if symptoms persist. No physical activities for the next few days. Note written for work since she does a lot of standing and walking at work. Follow-up with [facility] next week (4 to 5 days) if right knee pain is not improving or sooner if worsening.  ?Final Clinical Impressions(s) / UC Diagnoses  ? ?Final diagnoses:  ?Right medial knee pain  ?Sprain of medial collateral ligament of right knee, initial encounter  ? ? ? ?Discharge Instructions   ? ?  ?Recommend start Voltaren 75mg  every 12 hours as needed for pain. Continue to keep right knee/leg elevated as much as possible. Apply ice to area every 3 to 4 hours for the next 24 hours. Wear right knee brace during the day for support. Follow-up with [facility]  next week if symptoms are not improving.  ? ? ? ?ED Prescriptions   ? ? Medication Sig Dispense Auth. Provider  ? diclofenac (VOLTAREN) 75 MG EC tablet Take 1 tablet (75 mg total) by mouth every 12 (twelve) hours a

## 2021-10-12 NOTE — Discharge Instructions (Addendum)
Recommend start Voltaren 75mg  every 12 hours as needed for pain. Continue to keep right knee/leg elevated as much as possible. Apply ice to area every 3 to 4 hours for the next 24 hours. Wear right knee brace during the day for support. Follow-up with [facility] next week if symptoms are not improving.  ?

## 2021-10-12 NOTE — ED Triage Notes (Signed)
Patient is here for "Right knee pain" due to (previous) injury. Yesterday played soccer and felt something pop "again".  ?

## 2021-11-24 ENCOUNTER — Emergency Department: Payer: Self-pay

## 2021-11-24 ENCOUNTER — Emergency Department
Admission: EM | Admit: 2021-11-24 | Discharge: 2021-11-24 | Disposition: A | Discharge status: Home / Self Care | Payer: Self-pay | Attending: Emergency Medicine | Admitting: Emergency Medicine

## 2021-11-24 ENCOUNTER — Ambulatory Visit
Admission: EM | Admit: 2021-11-24 | Discharge: 2021-11-24 | Disposition: A | Discharge status: Home / Self Care | Payer: Medicaid Other

## 2021-11-24 DIAGNOSIS — K529 Noninfective gastroenteritis and colitis, unspecified: Secondary | ICD-10-CM | POA: Insufficient documentation

## 2021-11-24 DIAGNOSIS — Z79899 Other long term (current) drug therapy: Secondary | ICD-10-CM | POA: Insufficient documentation

## 2021-11-24 DIAGNOSIS — R1032 Left lower quadrant pain: Secondary | ICD-10-CM

## 2021-11-24 DIAGNOSIS — R112 Nausea with vomiting, unspecified: Secondary | ICD-10-CM

## 2021-11-24 LAB — COMPREHENSIVE METABOLIC PANEL
ALT: 17 U/L (ref 0–44)
AST: 25 U/L (ref 15–41)
Albumin: 4.4 g/dL (ref 3.5–5.0)
Alkaline Phosphatase: 65 U/L (ref 38–126)
Anion gap: 9 (ref 5–15)
BUN: 11 mg/dL (ref 6–20)
CO2: 23 mmol/L (ref 22–32)
Calcium: 9.6 mg/dL (ref 8.9–10.3)
Chloride: 107 mmol/L (ref 98–111)
Creatinine, Ser: 0.91 mg/dL (ref 0.44–1.00)
GFR, Estimated: >60 mL/min (ref >60)
Glucose, Bld: 126 mg/dL — ABNORMAL HIGH (ref 70–99)
Potassium: 3.6 mmol/L (ref 3.5–5.1)
Sodium: 139 mmol/L (ref 135–145)
Total Bilirubin: 1.3 mg/dL — ABNORMAL HIGH (ref 0.3–1.2)
Total Protein: 7.8 g/dL (ref 6.5–8.1)

## 2021-11-24 LAB — URINALYSIS, ROUTINE W REFLEX MICROSCOPIC
Bilirubin Urine: NEGATIVE
Glucose, UA: NEGATIVE mg/dL
Ketones, ur: NEGATIVE mg/dL
Leukocytes,Ua: NEGATIVE
Nitrite: NEGATIVE
Protein, ur: NEGATIVE mg/dL
Specific Gravity, Urine: 1.023 (ref 1.005–1.030)
pH: 5 (ref 5.0–8.0)

## 2021-11-24 LAB — URINE DRUG SCREEN, QUALITATIVE (ARMC ONLY)
Amphetamines, Ur Screen: NOT DETECTED
Barbiturates, Ur Screen: NOT DETECTED
Benzodiazepine, Ur Scrn: NOT DETECTED
Cannabinoid 50 Ng, Ur ~~LOC~~: POSITIVE — AB
Cocaine Metabolite,Ur ~~LOC~~: NOT DETECTED
MDMA (Ecstasy)Ur Screen: NOT DETECTED
Methadone Scn, Ur: NOT DETECTED
Opiate, Ur Screen: POSITIVE — AB
Phencyclidine (PCP) Ur S: NOT DETECTED
Tricyclic, Ur Screen: NOT DETECTED

## 2021-11-24 LAB — POC URINE PREG, ED: Preg Test, Ur: NEGATIVE

## 2021-11-24 LAB — CBC
HCT: 41.4 % (ref 36.0–46.0)
Hemoglobin: 13.3 g/dL (ref 12.0–15.0)
MCH: 26.5 pg (ref 26.0–34.0)
MCHC: 32.1 g/dL (ref 30.0–36.0)
MCV: 82.5 fL (ref 80.0–100.0)
Platelets: 300 10*3/uL (ref 150–400)
RBC: 5.02 MIL/uL (ref 3.87–5.11)
RDW: 15.8 % — ABNORMAL HIGH (ref 11.5–15.5)
WBC: 9.4 10*3/uL (ref 4.0–10.5)
nRBC: 0 % (ref 0.0–0.2)

## 2021-11-24 MED ORDER — MORPHINE SULFATE (PF) 4 MG/ML IV SOLN
4.0000 mg | INTRAVENOUS | Status: DC | PRN
  Administered 2021-11-24: 4 mg via INTRAVENOUS
  Filled 2021-11-24 (×2): qty 1

## 2021-11-24 MED ORDER — ONDANSETRON 4 MG PO TBDP: 4.0000 mg | ORAL_TABLET | Freq: Three times a day (TID) | ORAL | 0 refills | Status: DC | PRN

## 2021-11-24 MED ORDER — SODIUM CHLORIDE 0.9 % IV BOLUS
1000.0000 mL | Freq: Once | INTRAVENOUS | Status: AC
  Administered 2021-11-24: 1000 mL via INTRAVENOUS

## 2021-11-24 MED ORDER — DROPERIDOL 2.5 MG/ML IJ SOLN
2.5000 mg | Freq: Once | INTRAMUSCULAR | Status: AC
  Administered 2021-11-24: 2.5 mg via INTRAVENOUS
  Filled 2021-11-24: qty 2

## 2021-11-24 MED ORDER — ONDANSETRON HCL 4 MG/2ML IJ SOLN
4.0000 mg | Freq: Once | INTRAMUSCULAR | Status: AC
  Administered 2021-11-24: 4 mg via INTRAVENOUS
  Filled 2021-11-24: qty 2

## 2021-11-24 MED ORDER — IOHEXOL 300 MG/ML  SOLN
80.0000 mL | Freq: Once | INTRAMUSCULAR | Status: AC | PRN
  Administered 2021-11-24: 80 mL via INTRAVENOUS
  Filled 2021-11-24: qty 80

## 2021-11-24 NOTE — ED Triage Notes (Signed)
Pt comes with lower abd pain. Was sent by UC. No emesis. Writhing in pain in bed.  ?

## 2021-11-24 NOTE — ED Notes (Signed)
Started 2 hrs ago. Pt c/o heat flashes along with her abd pain, and nausea. ?

## 2021-11-24 NOTE — ED Notes (Signed)
Patient is being discharged from the Urgent Care and sent to the Emergency Department via Ambulance . Per Provider, patient is in need of higher level of care due to [facility] Patient is aware and verbalizes understanding of plan of care.  ?Vitals:  ? 11/24/21 1233  ?Pulse: 100  ?Resp: (!) 28  ?Temp: 97.9 ?F (36.6 ?C)  ?SpO2: 100%  ?  ?

## 2021-11-24 NOTE — ED Triage Notes (Signed)
Patient is here for "Abd Pain" for about "2 hrs". Some nausea "no vomiting".  ?

## 2021-11-24 NOTE — Discharge Instructions (Addendum)
Please go to the emergency department for evaluation of your abdominal pain and adequate pain control. ?

## 2021-11-24 NOTE — ED Notes (Signed)
Patient was cooperative with EKG and giving meds.  ?

## 2021-11-24 NOTE — ED Provider Notes (Signed)
?[facility] ? ? ? ?CSN: [id number] ?Arrival date & time: 11/24/21  1222 ? ? ?  ? ?History   ?Chief Complaint ?Chief Complaint  ?Patient presents with  ? Abdominal Pain  ? Nausea  ? ? ?HPI ?Alice Lewis is a 26 y.o. female.  ? ?HPI ? ?35 old female here for evaluation of abdominal pain. ? ?Patient is markedly uncomfortable, writhing in pain, and complaining of acute onset of severe, stabbing left lower quadrant abdominal pain that started 2 hours ago.  She is rating her pain as a 10/10.  She does complain of nausea and sweating but does not endorse any vomiting or fever.  She states she had pain similar to this in the past and its been attributed to an ovarian cyst.  She did start her menstrual cycle this morning. ? ?History reviewed. No pertinent past medical history. ? ?There are no problems to display for this patient. ? ? ?Past Surgical History:  ?Procedure Laterality Date  ? NO PAST SURGERIES    ? ? ?OB History   ?No obstetric history on file. ?  ? ? ? ?Home Medications   ? ?Prior to Admission medications   ?Medication Sig Start Date End Date Taking? Authorizing Provider  ?diclofenac (VOLTAREN) 75 MG EC tablet Take 1 tablet (75 mg total) by mouth every 12 (twelve) hours as needed for moderate pain. 10/12/21   Sudie Grumbling, NP  ?meloxicam (MOBIC) 7.5 MG tablet Take 7.5 mg by mouth daily. 10/18/21   [provider]  ?norgestimate-ethinyl estradiol (ORTHO-CYCLEN) 0.25-35 MG-MCG tablet Take 1 tablet by mouth daily. 07/25/21   [provider]  ? ? ?Family History ?Family History  ?Problem Relation Age of Onset  ? Stroke Mother   ? Healthy Father   ? ? ?Social History ?Social History  ? ?Tobacco Use  ? Smoking status: Some Days  ?  Types: Cigarettes, Cigars  ? Smokeless tobacco: Never  ?Vaping Use  ? Vaping Use: Some days  ?Substance Use Topics  ? Alcohol use: Yes  ?  Comment: Occ.  ? Drug use: Yes  ?  Types: Marijuana  ? ? ? ?Allergies   ?Patient has no known allergies. ? ? ?Review of  Systems ?Review of Systems  ?Constitutional:  Positive for diaphoresis. Negative for fever.  ?Gastrointestinal:  Positive for abdominal pain and nausea. Negative for vomiting.  ? ? ?Physical Exam ?Triage Vital Signs ?ED Triage Vitals  ?Enc Vitals Group  ?   BP --   ?   Pulse Rate 11/24/21 1233 100  ?   Resp 11/24/21 1233 (!) 28  ?   Temp 11/24/21 1233 97.9 ?F (36.6 ?C)  ?   Temp Source 11/24/21 1233 Oral  ?   SpO2 11/24/21 1233 100 %  ?   Weight 11/24/21 1232 147 lb 14.9 oz (67.1 kg)  ?   Height 11/24/21 1232 5\' 5"  (1.651 m)  ?   Head Circumference --   ?   Peak Flow --   ?   Pain Score 11/24/21 1229 10  ?   Pain Loc --   ?   Pain Edu? --   ?   Excl. in GC? --   ? ?No data found. ? ?Updated Vital Signs ?Pulse 100   Temp 97.9 ?F (36.6 ?C) (Oral)   Resp (!) 28   Ht 5\' 5"  (1.651 m)   Wt 147 lb 14.9 oz (67.1 kg)   LMP 11/24/2021 (Exact Date)   SpO2 100%  BMI 24.62 kg/m?  ? ?Visual Acuity ?Right Eye Distance:   ?Left Eye Distance:   ?Bilateral Distance:   ? ?Right Eye Near:   ?Left Eye Near:    ?Bilateral Near:    ? ?Physical Exam ?Vitals and nursing note reviewed.  ?Constitutional:   ?   General: She is in acute distress.  ?   Appearance: She is well-developed.  ?HENT:  ?   Head: Normocephalic and atraumatic.  ?Eyes:  ?   Extraocular Movements: Extraocular movements intact.  ?   Pupils: Pupils are equal, round, and reactive to light.  ?Cardiovascular:  ?   Rate and Rhythm: Normal rate and regular rhythm.  ?   Heart sounds: Normal heart sounds. No murmur heard. ?  No friction rub. No gallop.  ?Pulmonary:  ?   Effort: Pulmonary effort is normal.  ?   Breath sounds: Normal breath sounds. No wheezing, rhonchi or rales.  ?Abdominal:  ?   General: Abdomen is flat. Bowel sounds are normal. There is no distension.  ?   Palpations: Abdomen is soft. There is no hepatomegaly or splenomegaly.  ?   Tenderness: There is abdominal tenderness in the epigastric area and left lower quadrant.  ?Skin: ?   General: Skin is warm and  dry.  ?   Capillary Refill: Capillary refill takes less than 2 seconds.  ?Neurological:  ?   General: No focal deficit present.  ?   Mental Status: She is alert and oriented to person, place, and time.  ? ? ? ?UC Treatments / Results  ?Labs ?(all labs ordered are listed, but only abnormal results are displayed) ?Labs Reviewed - No data to display ? ? ?EKG ? ? ?Radiology ?No results found. ? ?Procedures ?Procedures (including critical care time) ? ?Medications Ordered in UC ?Medications - No data to display ? ?Initial Impression / Assessment and Plan / UC Course  ?I have reviewed the triage vital signs and the nursing notes. ? ?Pertinent labs & imaging results that were available during my care of the patient were reviewed by me and considered in my medical decision making (see chart for details). ? ?Patient is a 26 year old female who is in acute distress and presents for evaluation of acute onset of left lower quadrant abdominal pain that she is rating a 10/10.  This is associated with nausea and sweating but no vomiting or fever.  She has had similar symptoms in the past that have been related to an ovarian cyst.  She started her menstrual cycle today.  Patient is in a great degree of pain and is unable to sit still in the wheelchair so it is difficult to get a good assessment.  Cardiopulmonary exam reveals S1-S2 heart sounds with regular rate and rhythm and lung sounds that are clear to auscultation all fields.  Abdomen is flat and soft with epigastric tenderness as well as marked left lower quadrant tenderness.  I have advised the patient that I do not have any pain medication to give her and given her degree of discomfort I feel she should be evaluated in the emergency department.  I also did not have any complex imaging such as ultrasound or CT scan to rule out the presence or absence of ovarian torsion versus cyst.  Patient is in agreement.  Rescue squad has been called to transport patient to [facility] for evaluation and pain control. ? ?Report given to [facility] emergency medical services and care transferred. ? ?Final Clinical  Impressions(s) / UC Diagnoses  ? ?Final diagnoses:  ?Left lower quadrant abdominal pain  ? ? ? ?Discharge Instructions   ? ?  ?Please go to the emergency department for evaluation of your abdominal pain and adequate pain control. ? ? ? ? ?ED Prescriptions   ?None ?  ? ?PDMP not reviewed this encounter. ?  ?Becky Augustayan, Tyanna Hach, NP ?11/24/21 1257 ? ?

## 2021-11-24 NOTE — ED Notes (Signed)
Pt verbalized understanding of discharge instructions, prescriptions, and follow-up care instructions. Pt advised if symptoms worsen to return to ED.  

## 2021-11-24 NOTE — ED Notes (Signed)
Pt states she has someone to pick her up.  ?

## 2021-11-24 NOTE — ED Notes (Signed)
Unable to obtain BP after numerous attempts during intake. Patient moving a lot in wheelchair due to pain. ?

## 2021-11-24 NOTE — ED Notes (Signed)
Called EMS for Transport to [facility] [address] Roten CMA ?

## 2021-11-24 NOTE — ED Provider Notes (Signed)
? ?[facility] ?Provider Note ? ? ? Event Date/Time  ? First MD Initiated Contact with Patient 11/24/21 1334   ?  (approximate) ? ? ?History  ? ?No chief complaint on file. ? ? ?HPI ? ?Alice Lewis is a 26 y.o. female   of abdominal pain with sudden onset while she was sleeping.  She went to [facility] urgent care where she was sent by EMS to the emergency department for evaluation of her abdominal pain.  It was noted that at that time she complained of epigastric and left lower quadrant pain..  She denies any urinary symptoms, vaginal discharge but endorses vomiting approximately 8 times today.  She denies any diarrhea.  Not aware of any fever or chills.  Patient reports that she knows she is not pregnant as she is in a homosexual relationship. ? ?  ? ? ?Physical Exam  ? ?Triage Vital Signs: ?ED Triage Vitals  ?Enc Vitals Group  ?   BP   ?   Pulse   ?   Resp   ?   Temp   ?   Temp src   ?   SpO2   ?   Weight   ?   Height   ?   Head Circumference   ?   Peak Flow   ?   Pain Score   ?   Pain Loc   ?   Pain Edu?   ?   Excl. in [place]?   ? ? ?Most recent vital signs: ?Vitals:  ? 11/24/21 1443 11/24/21 1524  ?BP: (!) 89/55 113/75  ?Pulse: 83 63  ?Resp: 18 12  ?Temp: 98.2 ?F (36.8 ?C)   ?SpO2: 93% 100%  ? ? ? ?General: Awake, no distress.  Crying and lying on her side.  Patient is noted to be vomiting.  Patient has balled up on the stretcher and refuses to be examined.  Patient is frequently screaming out. ?CV:  Good peripheral perfusion.  Heart regular rate and rhythm. ?Resp:  Normal effort.  Lungs are clear bilaterally. ?Abd:  No distention.  Abdomen with diffuse tenderness throughout which is difficult to assess secondary to patient's inability to lie supine on the stretcher.  Bowel sounds are present x4 quadrants.  No tenderness is noted to the epigastric area but continues in the left lower quadrant area and suprapubic. ?Other:  No edema noted lower extremities. ? ? ?ED Results / Procedures /  Treatments  ? ?Labs ?(all labs ordered are listed, but only abnormal results are displayed) ?Labs Reviewed  ?CBC - Abnormal; Notable for the following components:  ?    Result Value  ? RDW 15.8 (*)   ? All other components within normal limits  ?COMPREHENSIVE METABOLIC PANEL - Abnormal; Notable for the following components:  ? Glucose, Bld 126 (*)   ? Total Bilirubin 1.3 (*)   ? All other components within normal limits  ?URINALYSIS, ROUTINE W REFLEX MICROSCOPIC - Abnormal; Notable for the following components:  ? Color, Urine YELLOW (*)   ? APPearance HAZY (*)   ? Hgb urine dipstick LARGE (*)   ? Bacteria, UA RARE (*)   ? All other components within normal limits  ?URINE DRUG SCREEN, QUALITATIVE ([facility] ONLY) - Abnormal; Notable for the following components:  ? Opiate, Ur Screen POSITIVE (*)   ? Cannabinoid 50 Ng, Ur [place] POSITIVE (*)   ? All other components within normal limits  ?POC URINE PREG, ED  ? ? ? ?EKG ? ?  Vent. rate 58 BPM ?PR interval 148 ms ?QRS duration 80 ms ?QT/QTcB 420/412 ms ?P-R-T axes 23 82 72 ?Sinus bradycardia with sinus arrhythmia ? ? ?RADIOLOGY ? ?CT abdomen and pelvis with contrast radiology report shows a few fluid-filled loops of small bowel in the lower abdomen and pelvis with question of adjacent mesenteric edema suggesting enteritis. ? ? ? ?PROCEDURES: ? ?Critical Care performed:  ? ?Procedures ? ? ?MEDICATIONS ORDERED IN ED: ?Medications  ?morphine (PF) 4 MG/ML injection 4 mg (4 mg Intravenous Not Given 11/24/21 1428)  ?ondansetron (ZOFRAN) injection 4 mg (4 mg Intravenous Given 11/24/21 1351)  ?iohexol (OMNIPAQUE) 300 MG/ML solution 80 mL (80 mLs Intravenous Contrast Given 11/24/21 1446)  ?droperidol (INAPSINE) 2.5 MG/ML injection 2.5 mg (2.5 mg Intravenous Given 11/24/21 1522)  ?sodium chloride 0.9 % bolus 1,000 mL (1,000 mLs Intravenous New Bag/Given 11/24/21 1521)  ? ? ? ?IMPRESSION / MDM / ASSESSMENT AND PLAN / ED COURSE  ?I reviewed the triage vital signs and the nursing  notes. ? ? ?Differential diagnosis includes, but is not limited to, nausea and vomiting, abdominal pain, urinary tract infection, appendicitis, gastritis. ? ? ?26 year old female presents to the ED with complaint of abdominal pain and vomiting.  Patient was sent by EMS from [facility] urgent care for the same complaints.  During exam patient was noted to be actively vomiting and screaming out "in pain".  Patient was given Zofran 4 mg IV along with morphine 4 mg IV which helped slightly.  Lab work was reassuring with WBC 9.4, met C essentially normal with a glucose of 126, urinalysis showed 5-6 RBCs and rare bacteria.  Pregnancy test was negative.  UDS did show positive opiates and cannabis.  EKG showed sinus bradycardia with a ventricular rate of 58.  CT abdomen and pelvis with contrast showed a few fluid-filled small bowel loops in the lower abdomen and pelvis.  Has enteric edema suggestive of enteritis.  No appendicitis was noted.  Patient later was given droperidol 2.5 mg IV by Dr. Quentin Cornwall who is also managing this patient.  Patient had no continued nausea, vomiting or abdominal pain. ?----------------------------------------- ?4:08 PM on 11/24/2021 ?----------------------------------------- ?At this time patient states she is not having any pain or continued vomiting.  She is already called a friend for a ride so that she can be discharged.  Patient is sitting up in bed more alert than she has been during her entire ED stay.  She does not appear to be uncomfortable nor she having any nausea.  Patient was told that Zofran would be sent to the pharmacy and for her to continue drinking fluids.  She is to follow-up with her PCP or urgent care.  She was given return precautions if her symptoms worsen such as fever, persistent vomiting or increased abdominal pain. ? ? ? ?FINAL CLINICAL IMPRESSION(S) / ED DIAGNOSES  ? ?Final diagnoses:  ?Enteritis  ?Nausea and vomiting, unspecified vomiting type  ? ? ? ?Rx / DC Orders   ? ?ED Discharge Orders   ? ?      Ordered  ?  ondansetron (ZOFRAN-ODT) 4 MG disintegrating tablet  Every 8 hours PRN       ? 11/24/21 1601  ? ?  ?  ? ?  ? ? ? ?Note:  This document was prepared using Dragon voice recognition software and may include unintentional dictation errors. ?  ?Johnn Hai, PA-C ?11/24/21 1610 ? ?  ?Merlyn Lot, MD ?11/24/21 1709 ? ?

## 2021-11-24 NOTE — Discharge Instructions (Addendum)
Follow-up with your primary care provider or urgent care if any continued problems.  A prescription for Zofran was sent to the pharmacy should you need it for any nausea or vomiting.  Drink lots of fluids to stay hydrated.  Return to the emergency department if any severe worsening of your symptoms such as fever, persistent vomiting or worsening of your abdominal pain. ?

## 2021-12-23 ENCOUNTER — Encounter: Payer: Self-pay | Admitting: Emergency Medicine

## 2021-12-23 ENCOUNTER — Ambulatory Visit
Admission: EM | Admit: 2021-12-23 | Discharge: 2021-12-23 | Disposition: A | Discharge status: Home / Self Care | Payer: Medicaid Other | Attending: Emergency Medicine | Admitting: Emergency Medicine

## 2021-12-23 DIAGNOSIS — R1084 Generalized abdominal pain: Secondary | ICD-10-CM

## 2021-12-23 MED ORDER — KETOROLAC TROMETHAMINE 60 MG/2ML IM SOLN
30.0000 mg | Freq: Once | INTRAMUSCULAR | Status: AC
  Administered 2021-12-23: 30 mg via INTRAMUSCULAR

## 2021-12-23 NOTE — ED Provider Notes (Signed)
[facility]    CSN: [id number] Arrival date & time: 12/23/21  1051      History   Chief Complaint Chief Complaint  Patient presents with   Alice Lewis    HPI Tyffani Foglesong is a 26 y.o. female.   HPI  50 old female here for evaluation of Alice Lewis.  Patient reports that she has been experiencing severe 10 of 10 Alice Lewis since this morning.  She states that she started her menstrual cycle around 2 AM this morning.  She did have 1 episode of nausea and vomiting here in the clinic but did not have any previously.  She also denies any diarrhea.  No fever, painful urination, urinary urgency, urinary frequency, or vaginal discharge.  She does have a history of ovarian cysts that have had to be drained in the past.  She was on birth control but she ran out and has not had any birth control pills.  She states that he thinks this is her menstrual cramps but she did not have any medication at home to take.  History reviewed. No pertinent past medical history.  There are no problems to display for this patient.   Past Surgical History:  Procedure Laterality Date   NO PAST SURGERIES      OB History   No obstetric history on file.      Home Medications    Prior to Admission medications   Medication Sig Start Date End Date Taking? Authorizing Provider  diclofenac (VOLTAREN) 75 MG EC tablet Take 1 tablet (75 mg total) by mouth every 12 (twelve) hours as needed for moderate Lewis. 10/12/21   Sudie Grumbling, NP  norgestimate-ethinyl estradiol (ORTHO-CYCLEN) 0.25-35 MG-MCG tablet Take 1 tablet by mouth daily. 07/25/21   [provider]  ondansetron (ZOFRAN-ODT) 4 MG disintegrating tablet Take 1 tablet (4 mg total) by mouth every 8 (eight) hours as needed for nausea or vomiting. 11/24/21   Tommi Rumps, PA-C    Family History Family History  Problem Relation Age of Onset   Stroke Mother    Healthy Father     Social History Social History    Tobacco Use   Smoking status: Some Days    Types: Cigarettes, Cigars   Smokeless tobacco: Never  Vaping Use   Vaping Use: Some days  Substance Use Topics   Alcohol use: Yes    Comment: Occ.   Drug use: Yes    Types: Marijuana     Allergies   Patient has no known allergies.   Review of Systems Review of Systems  Constitutional:  Negative for fever.  Gastrointestinal:  Positive for Alice Lewis, nausea and vomiting. Negative for diarrhea.    Physical Exam Triage Vital Signs ED Triage Vitals  Enc Vitals Group     BP 12/23/21 1118 (!) 145/86     Pulse Rate 12/23/21 1118 79     Resp 12/23/21 1118 14     Temp 12/23/21 1118 98.4 F (36.9 C)     Temp Source 12/23/21 1118 Oral     SpO2 12/23/21 1118 97 %     Weight 12/23/21 1116 147 lb 14.9 oz (67.1 kg)     Height 12/23/21 1116 5\' 5"  (1.651 m)     Head Circumference --      Peak Flow --      Lewis Score 12/23/21 1116 10     Lewis Loc --      Lewis Edu? --  Excl. in [place]? --    No data found.  Updated Vital Signs BP (!) 145/86 (BP Location: Left Arm)   Pulse 79   Temp 98.4 F (36.9 C) (Oral)   Resp 14   Ht 5\' 5"  (1.651 m)   Wt 147 lb 14.9 oz (67.1 kg)   LMP 12/23/2021 (Exact Date)   SpO2 97%   BMI 24.62 kg/m   Visual Acuity Right Eye Distance:   Left Eye Distance:   Bilateral Distance:    Right Eye Near:   Left Eye Near:    Bilateral Near:     Physical Exam Vitals and nursing note reviewed.  Constitutional:      General: She is in acute distress.     Appearance: She is well-developed.  HENT:     Head: Normocephalic and atraumatic.  Cardiovascular:     Rate and Rhythm: Normal rate and regular rhythm.     Heart sounds: Normal heart sounds. No murmur heard.   No friction rub. No gallop.  Pulmonary:     Effort: Pulmonary effort is normal.     Breath sounds: Normal breath sounds. No wheezing, rhonchi or rales.  Alice:     General: Abdomen is flat. Bowel sounds are normal.     Palpations:  Abdomen is soft.     Tenderness: There is generalized Alice tenderness. There is guarding.  Skin:    General: Skin is warm and dry.     Capillary Refill: Capillary refill takes less than 2 seconds.     Coloration: Skin is not pale.     Findings: No rash.  Neurological:     General: No focal deficit present.     Mental Status: She is alert and oriented to person, place, and time.     UC Treatments / Results  Labs (all labs ordered are listed, but only abnormal results are displayed) Labs Reviewed - No data to display  EKG   Radiology No results found.  Procedures Procedures (including critical care time)  Medications Ordered in UC Medications  ketorolac (TORADOL) injection 30 mg (has no administration in time range)    Initial Impression / Assessment and Plan / UC Course  I have reviewed the triage vital signs and the nursing notes.  Pertinent labs & imaging results that were available during my care of the patient were reviewed by me and considered in my medical decision making (see chart for details).  Patient is a 26 year old female here for evaluation of significant 10/10 diffuse Alice Lewis that started this morning.  Her menstrual cycle also started this morning and around 2 AM.  She has not had any nausea or vomiting until she came to the clinic and that that time she had 1 episode of vomiting for bilious fluid.  No blood in her emesis.  She denies any diarrhea, fever, urinary complaints, or vaginal complaints.  She does have a history of ovarian cysts, which have had to be drained in the past, and is typically on birth control to control those.  She has been out of birth control pills so she has not been taking them.  Patient's physical exam reveals a benign cardiopulmonary exam with S1-S2 heart sounds with regular rate and rhythm and lung sounds that are clear to auscultation all fields.  Patient's abdomen is difficult to assess due to the degree of Lewis which she  is in.  She does demonstrate both upper and lower Alice Lewis.  Her lower Alice Lewis seems to  be more severe.  She does have guarding on exam but no rebound.  I have advised her that she needs imaging of her abdomen to determine the origin of her Lewis.  It may very well be a ovarian cyst.  Patient is refusing to go to the ER and is demanding Lewis medication only.  I have advised her that I do not feel that is safe path to follow.  I will give her a shot of Toradol in the clinic and patient will sign out AMA as I do not have the ability to fully evaluate her in the urgent care and she is refusing to go to the ER for further evaluation and treatment.   Final Clinical Impressions(s) / UC Diagnoses   Final diagnoses:  Generalized Alice Lewis     Discharge Instructions      As I stated, we do not have the ability to fully evaluate your Alice Lewis here at the urgent care.  This may very well be coming from your menstrual cycle or could be an ovarian cyst.  In any event, you need advanced imaging of your abdomen to determine the exact cause.  If you have any worsening of your Alice Lewis or you start running a fever, or if you develop increased nausea and vomiting you need to call 911 and go to the ER.     ED Prescriptions   None    PDMP not reviewed this encounter.   Becky Augusta, NP 12/23/21 1148

## 2021-12-23 NOTE — ED Triage Notes (Signed)
Patient c/o abdominal pain that started early this morning.  Patient has not taken any medicine for the pain.  Patient denies N/V/D.  Patient started her mentrual cycle at 2 am this morning.

## 2021-12-23 NOTE — Discharge Instructions (Addendum)
As I stated, we do not have the ability to fully evaluate your abdominal pain here at the urgent care.  This may very well be coming from your menstrual cycle or could be an ovarian cyst.  In any event, you need advanced imaging of your abdomen to determine the exact cause.  If you have any worsening of your abdominal pain or you start running a fever, or if you develop increased nausea and vomiting you need to call 911 and go to the ER.

## 2022-01-28 ENCOUNTER — Other Ambulatory Visit: Payer: Self-pay

## 2022-01-28 ENCOUNTER — Encounter: Payer: Self-pay | Admitting: Emergency Medicine

## 2022-01-28 ENCOUNTER — Ambulatory Visit
Admission: EM | Admit: 2022-01-28 | Discharge: 2022-01-28 | Disposition: A | Discharge status: Home / Self Care | Payer: Medicaid Other | Attending: Emergency Medicine | Admitting: Emergency Medicine

## 2022-01-28 DIAGNOSIS — R0789 Other chest pain: Secondary | ICD-10-CM

## 2022-01-28 MED ORDER — BACLOFEN 10 MG PO TABS: 10.0000 mg | ORAL_TABLET | Freq: Three times a day (TID) | ORAL | 0 refills | Status: DC

## 2022-01-28 MED ORDER — IBUPROFEN 600 MG PO TABS: 600.0000 mg | ORAL_TABLET | Freq: Four times a day (QID) | ORAL | 0 refills | Status: DC | PRN

## 2022-01-28 NOTE — ED Provider Notes (Signed)
[facility]    CSN: [id number] Arrival date & time: 01/28/22  1918      History   Chief Complaint Chief Complaint  Patient presents with   chest soreness    HPI Alice Lewis is a 26 y.o. female.   HPI  26 year old female here for evaluation of left-sided chest pain.  Patient reports that she was at work at approximately 4 close afternoon when she developed pain in the left side of her chest that increases with movement of her left arm and also with deep breathing.  She denies any sweating, nausea, or shortness of breath.  She does not take anything for the pain.  History reviewed. No pertinent past medical history.  There are no problems to display for this patient.   Past Surgical History:  Procedure Laterality Date   NO PAST SURGERIES      OB History   No obstetric history on file.      Home Medications    Prior to Admission medications   Medication Sig Start Date End Date Taking? Authorizing Provider  baclofen (LIORESAL) 10 MG tablet Take 1 tablet (10 mg total) by mouth 3 (three) times daily. 01/28/22  Yes Becky Augusta, NP  ibuprofen (ADVIL) 600 MG tablet Take 1 tablet (600 mg total) by mouth every 6 (six) hours as needed. 01/28/22  Yes Becky Augusta, NP  norgestimate-ethinyl estradiol (ORTHO-CYCLEN) 0.25-35 MG-MCG tablet Take 1 tablet by mouth daily. 07/25/21   [provider]    Family History Family History  Problem Relation Age of Onset   Stroke Mother    Healthy Father     Social History Social History   Tobacco Use   Smoking status: Some Days    Types: Cigarettes, Cigars   Smokeless tobacco: Never  Vaping Use   Vaping Use: Former  Substance Use Topics   Alcohol use: Yes    Comment: Occ.   Drug use: Yes    Types: Marijuana     Allergies   Patient has no known allergies.   Review of Systems Review of Systems  Constitutional:  Negative for diaphoresis and fever.  Respiratory:  Negative for shortness of breath.    Cardiovascular:  Positive for chest pain.  Gastrointestinal:  Negative for nausea.     Physical Exam Triage Vital Signs ED Triage Vitals  Enc Vitals Group     BP 01/28/22 1935 111/78     Pulse Rate 01/28/22 1935 79     Resp 01/28/22 1935 18     Temp 01/28/22 1935 98.9 F (37.2 C)     Temp Source 01/28/22 1935 Oral     SpO2 01/28/22 1935 99 %     Weight --      Height --      Head Circumference --      Peak Flow --      Pain Score 01/28/22 1932 10     Pain Loc --      Pain Edu? --      Excl. in [place]? --    No data found.  Updated Vital Signs BP 111/78 (BP Location: Left Arm)   Pulse 79   Temp 98.9 F (37.2 C) (Oral)   Resp 18   LMP 01/21/2022   SpO2 99%   Visual Acuity Right Eye Distance:   Left Eye Distance:   Bilateral Distance:    Right Eye Near:   Left Eye Near:    Bilateral Near:     Physical Exam  Vitals and nursing note reviewed.  Constitutional:      Appearance: Normal appearance. She is not ill-appearing.  HENT:     Head: Normocephalic and atraumatic.  Cardiovascular:     Rate and Rhythm: Normal rate and regular rhythm.     Pulses: Normal pulses.     Heart sounds: Normal heart sounds. No murmur heard.    No friction rub. No gallop.  Pulmonary:     Effort: Pulmonary effort is normal.     Breath sounds: Normal breath sounds. No wheezing, rhonchi or rales.  Chest:     Chest wall: Tenderness present.  Skin:    General: Skin is warm and dry.     Capillary Refill: Capillary refill takes less than 2 seconds.  Neurological:     General: No focal deficit present.     Mental Status: She is alert and oriented to person, place, and time.  Psychiatric:        Mood and Affect: Mood normal.        Behavior: Behavior normal.        Thought Content: Thought content normal.        Judgment: Judgment normal.      UC Treatments / Results  Labs (all labs ordered are listed, but only abnormal results are displayed) Labs Reviewed - No data to  display  EKG   Radiology No results found.  Procedures Procedures (including critical care time)  Medications Ordered in UC Medications - No data to display  Initial Impression / Assessment and Plan / UC Course  I have reviewed the triage vital signs and the nursing notes.  Pertinent labs & imaging results that were available during my care of the patient were reviewed by me and considered in my medical decision making (see chart for details).  Patient is nontoxic-appearing 26 year old female here for evaluation of left-sided chest wall pain that began approximately 3 hours ago.  The pain increases with movement of the left arm and also with deep inspiration.  She denies shortness of breath, nausea, or sweats.  On exam patient has a normal cardiopulmonary exam with S1-S2 heart sounds with regular rate and rhythm and lung sounds that are clear to auscultation all fields.  Patient does have tension and tenderness when palpating the anterior aspect of the left chest wall.  No tenderness with palpation of the right.  Patient exam is consistent with musculoskeletal chest wall pain.  I will treat her with ibuprofen and baclofen.  Also moist heat, rest, and 2 days off work.  Return precautions reviewed.   Final Clinical Impressions(s) / UC Diagnoses   Final diagnoses:  Chest wall pain     Discharge Instructions      Take the ibuprofen, 600 mg every 6 hours with food, on a schedule for the next 48 hours and then as needed.  Take the Baclofen, 10 mg every 8 hours, on a schedule for the next 48 hours and then as needed.  Apply moist heat to your chest for 30 minutes at a time 2-3 times a day to improve blood flow to the area and help remove the lactic acid causing the spasm.  Return for reevaluation for any new or worsening symptoms.      ED Prescriptions     Medication Sig Dispense Auth. Provider   ibuprofen (ADVIL) 600 MG tablet Take 1 tablet (600 mg total) by mouth every 6  (six) hours as needed. 30 tablet Becky Augusta, NP   baclofen (LIORESAL)  10 MG tablet Take 1 tablet (10 mg total) by mouth 3 (three) times daily. 30 each Becky Augusta, NP      PDMP not reviewed this encounter.   Becky Augusta, NP 01/28/22 [phone]

## 2022-01-28 NOTE — Discharge Instructions (Addendum)
Take the ibuprofen, 600 mg every 6 hours with food, on a schedule for the next 48 hours and then as needed.  Take the Baclofen, 10 mg every 8 hours, on a schedule for the next 48 hours and then as needed.  Apply moist heat to your chest for 30 minutes at a time 2-3 times a day to improve blood flow to the area and help remove the lactic acid causing the spasm.  Return for reevaluation for any new or worsening symptoms.

## 2022-01-28 NOTE — ED Triage Notes (Signed)
Sharp, left chest pain that is worse with deep inspiration.  Patient has no cough.  Patient was tearful when met at in take desk.  Currently no longer crying.  Denies having felt this way before

## 2022-06-02 ENCOUNTER — Ambulatory Visit
Admission: EM | Admit: 2022-06-02 | Discharge: 2022-06-02 | Disposition: A | Discharge status: Home / Self Care | Payer: BC Managed Care – PPO | Attending: Physician Assistant | Admitting: Physician Assistant

## 2022-06-02 DIAGNOSIS — K0889 Other specified disorders of teeth and supporting structures: Secondary | ICD-10-CM

## 2022-06-02 DIAGNOSIS — Z9289 Personal history of other medical treatment: Secondary | ICD-10-CM

## 2022-06-02 MED ORDER — HYDROCODONE-ACETAMINOPHEN 7.5-325 MG PO TABS: 1.0000 | ORAL_TABLET | Freq: Four times a day (QID) | ORAL | 0 refills | Status: AC | PRN

## 2022-06-02 MED ORDER — AMOXICILLIN-POT CLAVULANATE 875-125 MG PO TABS: 1.0000 | ORAL_TABLET | Freq: Two times a day (BID) | ORAL | 0 refills | Status: AC

## 2022-06-02 NOTE — ED Triage Notes (Addendum)
Pt c/o dental pain, jaw pain, jaw swelling, neck pain, headaches x3days.  Pt had the right lower wisdom tooth pulled.   Pt states that the dentist did not give any medication.   Pt has been taking OTC tylenol and ibuprofen for the pain. Last dose was 600mg  tylenol at 3am

## 2022-06-02 NOTE — Discharge Instructions (Addendum)
-  I have sent antibiotics and pain medication to the pharmacy for you.  You may also continue ibuprofen, Orajel.  Drink plenty of fluids and soft foods. - If not feeling better in the next couple days, reach back out to your orthodontist.

## 2022-06-02 NOTE — ED Provider Notes (Signed)
[facility]    CSN: [id number] Arrival date & time: 06/02/22  0917      History   Chief Complaint Chief Complaint  Patient presents with   Dental Pain          HPI Alice Lewis is a 26 y.o. female who is presenting for 2 to 3-day history of dental pain in the right lower side with swelling, headaches painful chewing/eating.  Patient had 2 teeth removed and reports the orthodontist did not provide her with any pain medication so she has been taking ibuprofen and Tylenol but it has not helped.  She has not had any fevers.  Concerned about how much pain she is in and possible infection.  She says it is too painful to even eat or drink.  She has no other complaints.  HPI  History reviewed. No pertinent past medical history.  There are no problems to display for this patient.   Past Surgical History:  Procedure Laterality Date   NO PAST SURGERIES      OB History   No obstetric history on file.      Home Medications    Prior to Admission medications   Medication Sig Start Date End Date Taking? Authorizing Provider  amoxicillin-clavulanate (AUGMENTIN) 875-125 MG tablet Take 1 tablet by mouth every 12 (twelve) hours for 7 days. 06/02/22 06/09/22 Yes Shirlee Latch, PA-C  HYDROcodone-acetaminophen (NORCO) 7.5-325 MG tablet Take 1 tablet by mouth every 6 (six) hours as needed for up to 3 days for moderate pain or severe pain. 06/02/22 06/05/22 Yes Shirlee Latch, PA-C  baclofen (LIORESAL) 10 MG tablet Take 1 tablet (10 mg total) by mouth 3 (three) times daily. 01/28/22   Becky Augusta, NP  ibuprofen (ADVIL) 600 MG tablet Take 1 tablet (600 mg total) by mouth every 6 (six) hours as needed. 01/28/22   Becky Augusta, NP  norgestimate-ethinyl estradiol (ORTHO-CYCLEN) 0.25-35 MG-MCG tablet Take 1 tablet by mouth daily. 07/25/21   [provider]    Family History Family History  Problem Relation Age of Onset   Stroke Mother    Healthy Father      Social History Social History   Tobacco Use   Smoking status: Some Days    Types: Cigarettes, Cigars   Smokeless tobacco: Never  Vaping Use   Vaping Use: Former  Substance Use Topics   Alcohol use: Not Currently    Comment: Occ.   Drug use: Yes    Types: Marijuana     Allergies   Patient has no known allergies.   Review of Systems Review of Systems  Constitutional:  Negative for fatigue and fever.  HENT:  Positive for dental problem and facial swelling. Negative for trouble swallowing.   Neurological:  Positive for headaches.     Physical Exam Triage Vital Signs ED Triage Vitals  Enc Vitals Group     BP 06/02/22 0928 111/75     Pulse Rate 06/02/22 0928 85     Resp 06/02/22 0928 18     Temp 06/02/22 0928 98.7 F (37.1 C)     Temp Source 06/02/22 0928 Oral     SpO2 06/02/22 0928 96 %     Weight 06/02/22 0926 150 lb (68 kg)     Height 06/02/22 0926 5\' 7"  (1.702 m)     Head Circumference --      Peak Flow --      Pain Score 06/02/22 0926 9     Pain Loc --  Pain Edu? --      Excl. in [place]? --    No data found.  Updated Vital Signs BP 111/75 (BP Location: Left Arm)   Pulse 85   Temp 98.7 F (37.1 C) (Oral)   Resp 18   Ht 5\' 7"  (1.702 m)   Wt 150 lb (68 kg)   SpO2 96%   BMI 23.49 kg/m      Physical Exam Vitals and nursing note reviewed.  Constitutional:      General: She is not in acute distress.    Appearance: Normal appearance. She is not ill-appearing or toxic-appearing.  HENT:     Head: Normocephalic and atraumatic.     Nose: Nose normal.     Mouth/Throat:     Mouth: Mucous membranes are moist.     Pharynx: Oropharynx is clear.      Comments: The teeth circled in the picture above have been extracted.  Sutures are in place.  There is slight erythema and a lot of tenderness in this region.  No pustular drainage. Eyes:     General: No scleral icterus.       Right eye: No discharge.        Left eye: No discharge.      Conjunctiva/sclera: Conjunctivae normal.  Cardiovascular:     Rate and Rhythm: Normal rate and regular rhythm.  Pulmonary:     Effort: Pulmonary effort is normal. No respiratory distress.  Musculoskeletal:     Cervical back: Neck supple.  Skin:    General: Skin is dry.  Neurological:     General: No focal deficit present.     Mental Status: She is alert. Mental status is at baseline.     Motor: No weakness.     Gait: Gait normal.  Psychiatric:        Mood and Affect: Mood normal.        Behavior: Behavior normal.        Thought Content: Thought content normal.      UC Treatments / Results  Labs (all labs ordered are listed, but only abnormal results are displayed) Labs Reviewed - No data to display  EKG   Radiology No results found.  Procedures Procedures (including critical care time)  Medications Ordered in UC Medications - No data to display  Initial Impression / Assessment and Plan / UC Course  I have reviewed the triage vital signs and the nursing notes.  Pertinent labs & imaging results that were available during my care of the patient were reviewed by me and considered in my medical decision making (see chart for details).   26 year old female presents for abdominal pain of the right lower side after having 2 teeth extracted a couple days ago.  Patient was apparently not prescribed any medication by the orthodontist and continues to have significant pain.  No fever.  On examination there is evidence of the teeth that have been extracted.  Sutures are in place.  Slight erythema and a lot of tenderness in this region.  No bleeding.  We will treat at this time with Augmentin in case she is developing infection and also provide her with Norco after reviewing controlled substance database.  Reviewed following up with orthodontist if she is not improving.  ER precautions given.   Final Clinical Impressions(s) / UC Diagnoses   Final diagnoses:  Pain, dental  History  of dental surgery     Discharge Instructions      -I have sent antibiotics  and pain medication to the pharmacy for you.  You may also continue ibuprofen, Orajel.  Drink plenty of fluids and soft foods. - If not feeling better in the next couple days, reach back out to your orthodontist.     ED Prescriptions     Medication Sig Dispense Auth. Provider   amoxicillin-clavulanate (AUGMENTIN) 875-125 MG tablet Take 1 tablet by mouth every 12 (twelve) hours for 7 days. 14 tablet Shirlee Latch, PA-C   HYDROcodone-acetaminophen (NORCO) 7.5-325 MG tablet Take 1 tablet by mouth every 6 (six) hours as needed for up to 3 days for moderate pain or severe pain. 12 tablet Shirlee Latch, PA-C      I have reviewed the PDMP during this encounter.   Shirlee Latch, PA-C 06/02/22 [phone]

## 2022-07-01 ENCOUNTER — Ambulatory Visit
Admission: EM | Admit: 2022-07-01 | Discharge: 2022-07-01 | Discharge status: Against Medical Advice | Payer: BC Managed Care – PPO

## 2022-07-04 IMAGING — DX DG KNEE COMPLETE 4+V*R*
4 series · 4 of 4 positions shown · non-contrast
Comparison: None.

CLINICAL DATA: 25-year-old female with knee pain.

EXAM:
RIGHT KNEE - COMPLETE 4+ VIEW

[knee ap]
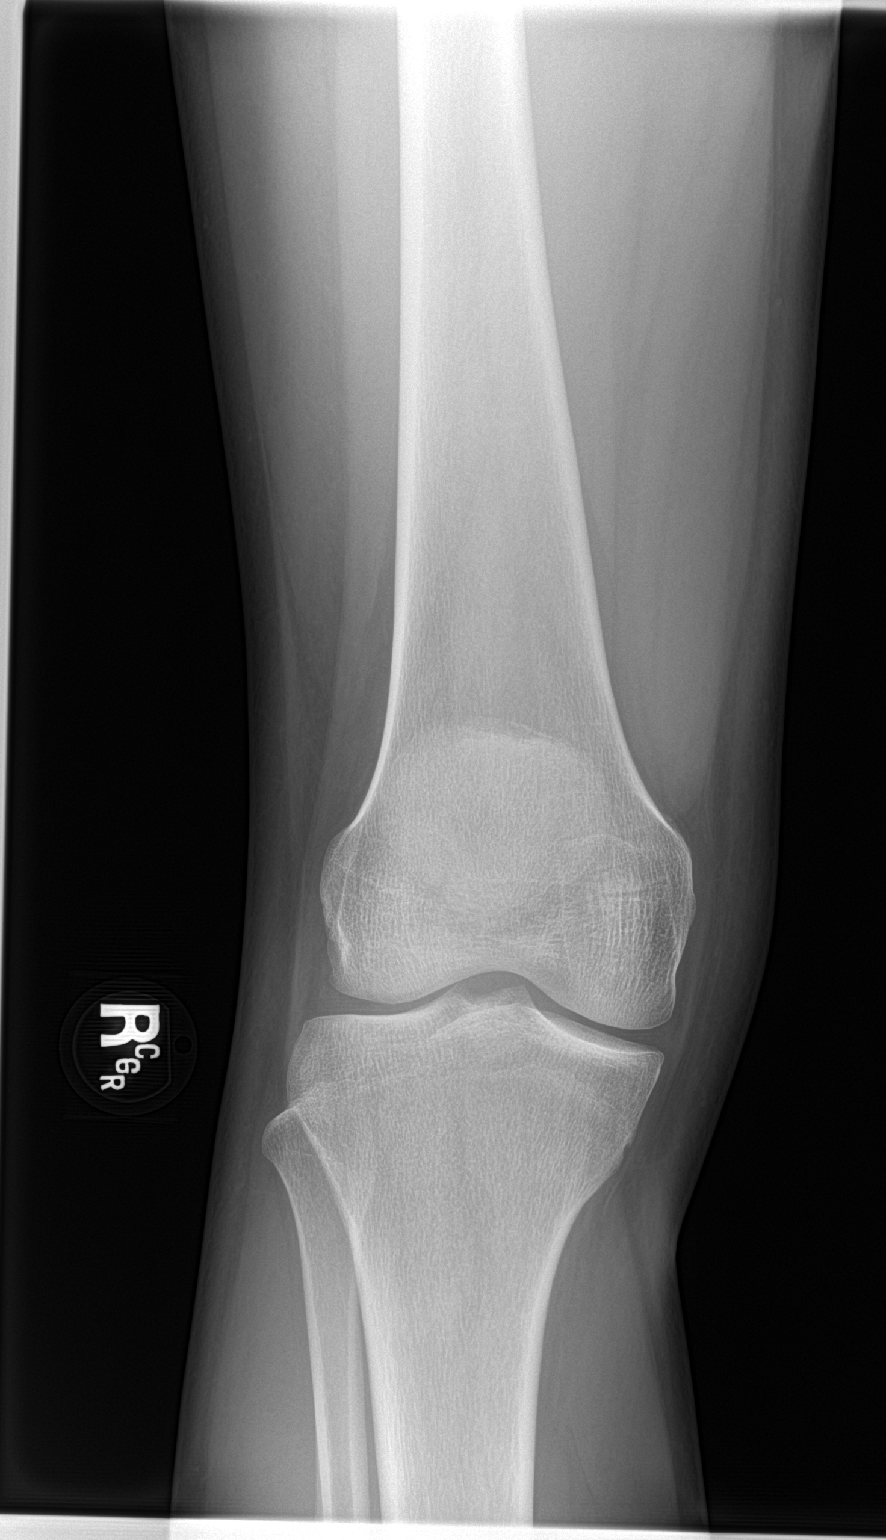

[knee lat]
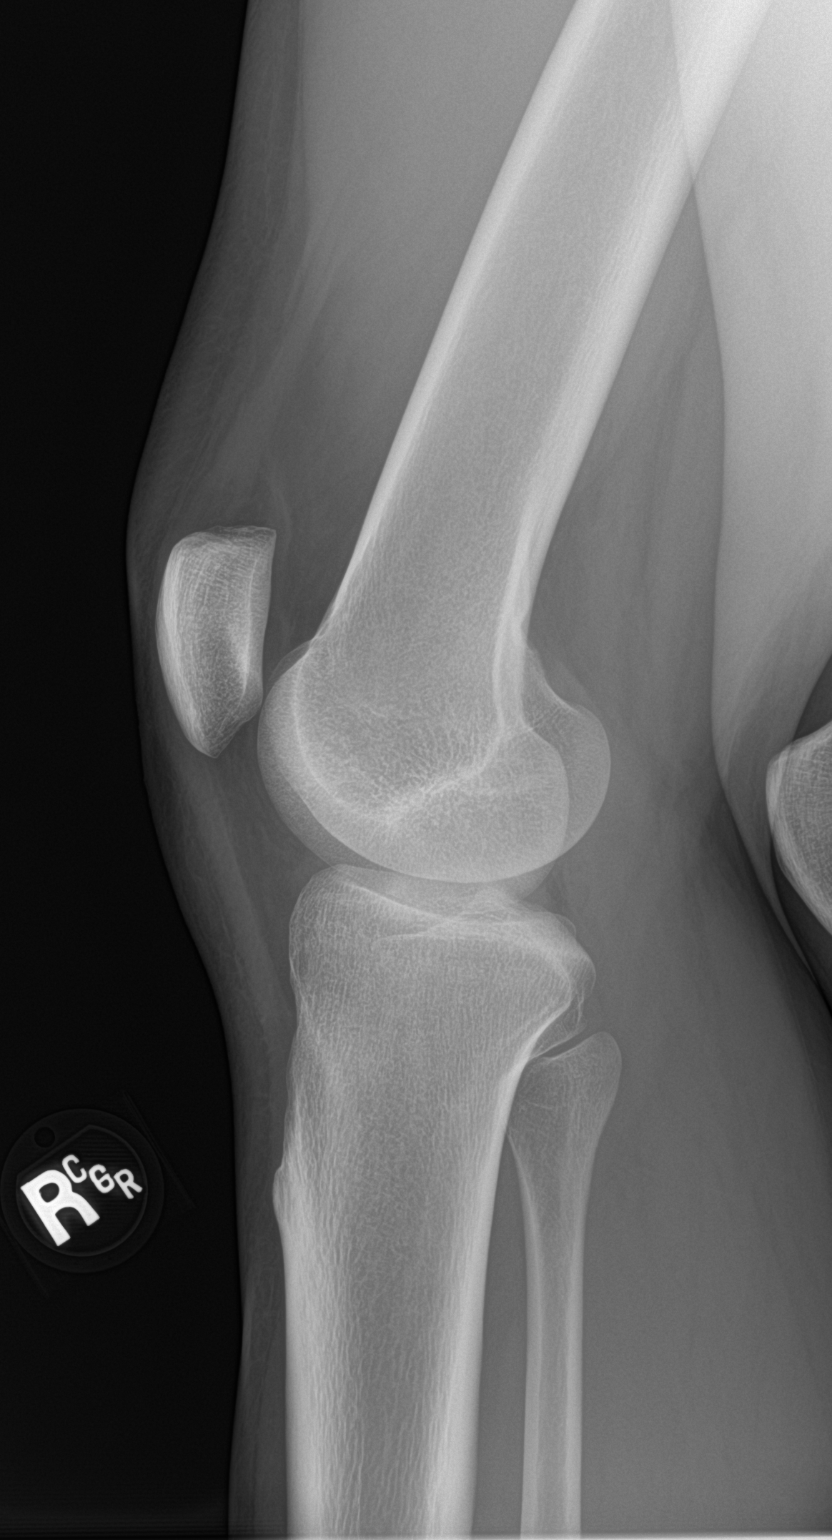

[knee obl (1 of 2)]
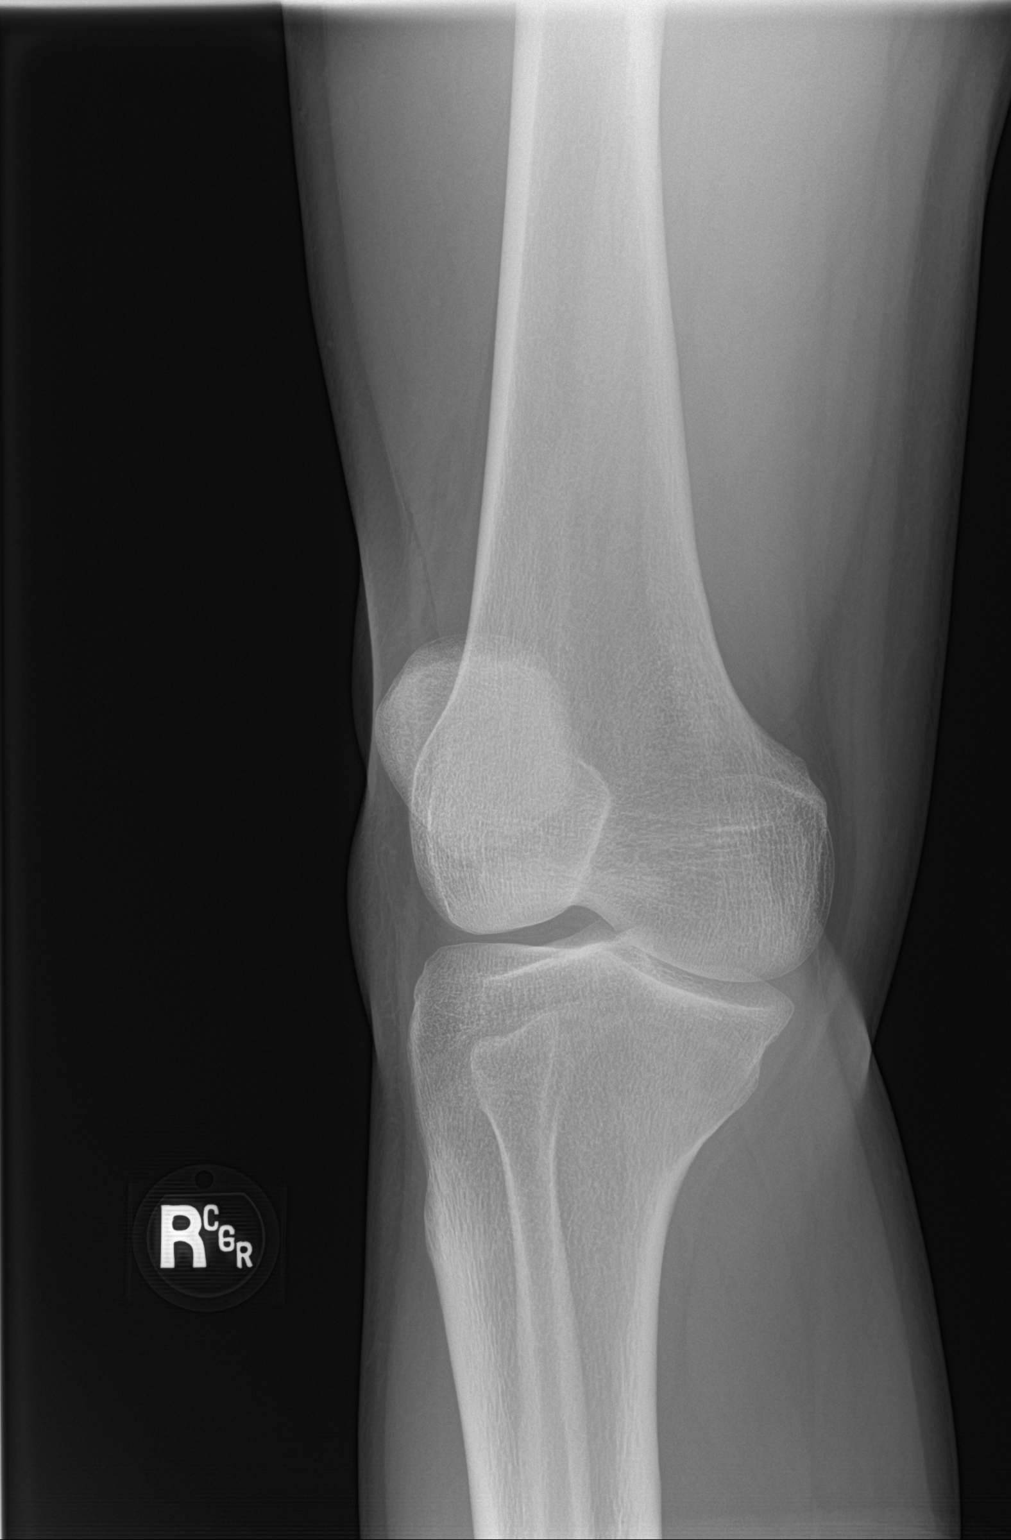

[knee obl (2 of 2)]
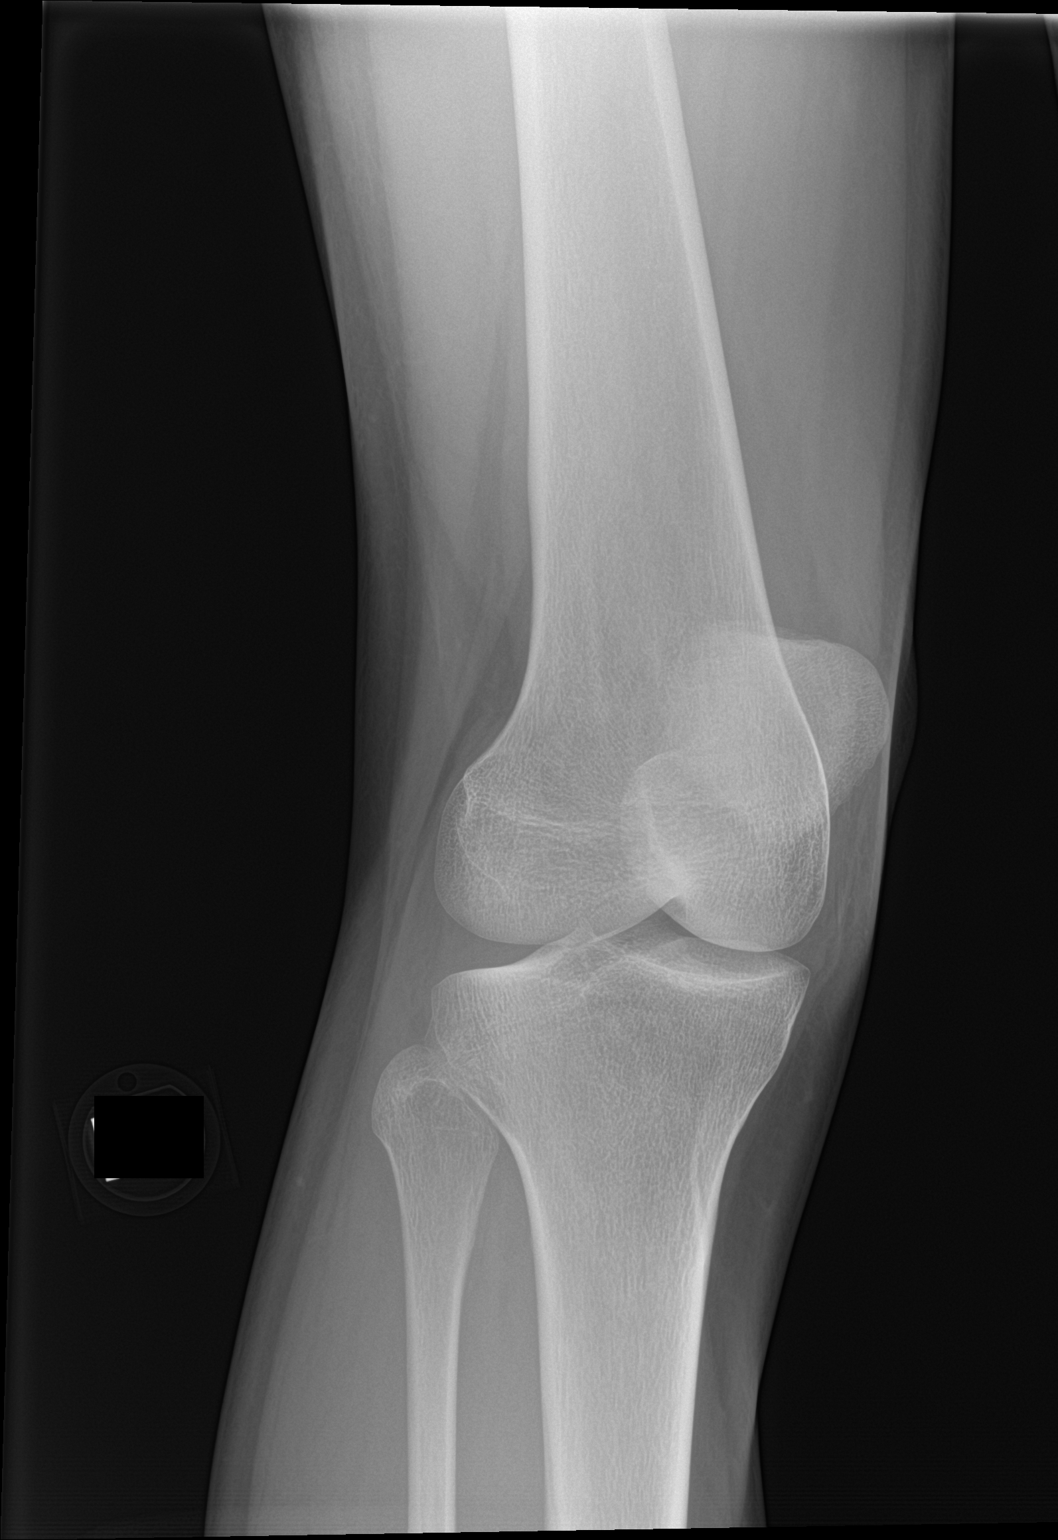

[4 of 4 positions shown; findings below may reference images not displayed]

FINDINGS: No evidence of fracture, dislocation, or joint effusion. No evidence
of arthropathy or other focal bone abnormality. Soft tissues are
unremarkable.
IMPRESSION: No acute fracture or malalignment.

## 2022-08-26 ENCOUNTER — Ambulatory Visit
Admission: EM | Admit: 2022-08-26 | Discharge: 2022-08-26 | Disposition: A | Discharge status: Home / Self Care | Payer: 59 | Attending: Emergency Medicine | Admitting: Emergency Medicine

## 2022-08-26 DIAGNOSIS — S29019A Strain of muscle and tendon of unspecified wall of thorax, initial encounter: Secondary | ICD-10-CM

## 2022-08-26 MED ORDER — BACLOFEN 10 MG PO TABS: 10.0000 mg | ORAL_TABLET | Freq: Three times a day (TID) | ORAL | 0 refills | Status: DC

## 2022-08-26 MED ORDER — IBUPROFEN 600 MG PO TABS: 600.0000 mg | ORAL_TABLET | Freq: Four times a day (QID) | ORAL | 0 refills | Status: DC | PRN

## 2022-08-26 NOTE — ED Provider Notes (Signed)
[facility]    CSN: [id number] Arrival date & time: 08/26/22  1924      History   Chief Complaint No chief complaint on file.   HPI Alice Lewis is a 27 y.o. female.   HPI  27 year old female here for evaluation of right sided mid back pain.  Patient reports that she has been experiencing pain in her right side of her mid back for the past 2 days.  Tonight the patient reports that she got home and sat down to rest.  When she went to get back up she felt the muscles in her back becoming tight.  She does a lot of heavy lifting at work but she does not remember any particular activity that brought on the pain.  She also denies any injury.  She has had back pain in the past.  History reviewed. No pertinent past medical history.  There are no problems to display for this patient.   Past Surgical History:  Procedure Laterality Date   NO PAST SURGERIES      OB History   No obstetric history on file.      Home Medications    Prior to Admission medications   Medication Sig Start Date End Date Taking? Authorizing Provider  norgestimate-ethinyl estradiol (ORTHO-CYCLEN) 0.25-35 MG-MCG tablet Take 1 tablet by mouth daily. 07/25/21  Yes [provider]  baclofen (LIORESAL) 10 MG tablet Take 1 tablet (10 mg total) by mouth 3 (three) times daily. 08/26/22   Margarette Canada, NP  ibuprofen (ADVIL) 600 MG tablet Take 1 tablet (600 mg total) by mouth every 6 (six) hours as needed. 08/26/22   Margarette Canada, NP    Family History Family History  Problem Relation Age of Onset   Stroke Mother    Healthy Father     Social History Social History   Tobacco Use   Smoking status: Some Days    Types: Cigarettes, Cigars   Smokeless tobacco: Never  Vaping Use   Vaping Use: Former  Substance Use Topics   Alcohol use: Not Currently    Comment: Occ.   Drug use: Yes    Types: Marijuana     Allergies   Patient has no known allergies.   Review of Systems Review of  Systems  Musculoskeletal:  Positive for back pain.  Neurological:  Negative for weakness and numbness.     Physical Exam Triage Vital Signs ED Triage Vitals  Enc Vitals Group     BP --      Pulse --      Resp --      Temp --      Temp src --      SpO2 --      Weight 08/26/22 1941 140 lb (63.5 kg)     Height 08/26/22 1941 5\' 9"  (1.753 m)     Head Circumference --      Peak Flow --      Pain Score 08/26/22 1940 9     Pain Loc --      Pain Edu? --      Excl. in [place]? --    No data found.  Updated Vital Signs BP 117/76 (BP Location: Left Arm)   Pulse 96   Temp 98.9 F (37.2 C) (Oral)   Resp 16   Ht 5\' 9"  (1.753 m)   Wt 140 lb (63.5 kg)   SpO2 100%   BMI 20.67 kg/m   Visual Acuity Right Eye Distance:  Left Eye Distance:   Bilateral Distance:    Right Eye Near:   Left Eye Near:    Bilateral Near:     Physical Exam Vitals and nursing note reviewed.  Constitutional:      Appearance: Normal appearance. She is not ill-appearing.  Cardiovascular:     Rate and Rhythm: Normal rate and regular rhythm.     Pulses: Normal pulses.     Heart sounds: Normal heart sounds. No murmur heard.    No friction rub. No gallop.  Pulmonary:     Effort: Pulmonary effort is normal.     Breath sounds: Normal breath sounds. No wheezing, rhonchi or rales.  Musculoskeletal:        General: Tenderness present. No swelling or signs of injury.  Skin:    General: Skin is warm and dry.     Capillary Refill: Capillary refill takes less than 2 seconds.  Neurological:     General: No focal deficit present.     Mental Status: She is alert and oriented to person, place, and time.  Psychiatric:        Mood and Affect: Mood normal.        Behavior: Behavior normal.        Thought Content: Thought content normal.        Judgment: Judgment normal.      UC Treatments / Results  Labs (all labs ordered are listed, but only abnormal results are displayed) Labs Reviewed - No data to  display  EKG   Radiology No results found.  Procedures Procedures (including critical care time)  Medications Ordered in UC Medications - No data to display  Initial Impression / Assessment and Plan / UC Course  I have reviewed the triage vital signs and the nursing notes.  Pertinent labs & imaging results that were available during my care of the patient were reviewed by me and considered in my medical decision making (see chart for details).   Patient is a nontoxic-appearing 27 year old female here for evaluation of right thoracic back pain that is been going on for last 2 days and worsened tonight.  She has no midline spinous process tenderness but there is tenderness in muscle tension in the entire thoracic right paraspinous region.  I suspect that she has a thoracic strain and I will treat her for thoracic strain with ibuprofen and baclofen.  I will have her take them on a schedule for the next 2 days and the back often as needed basis.  We also discussed using moist heat and home physical therapy.  Work note provided.   Final Clinical Impressions(s) / UC Diagnoses   Final diagnoses:  Thoracic myofascial strain, initial encounter     Discharge Instructions      Take the ibuprofen, 600 mg every 6 hours with food, on a schedule for the next 48 hours and then as needed.  Take the baclofen 10 mg every 8 hours, on a schedule for the next 48 hours and then as needed.  Apply moist heat to your back for 30 minutes at a time 2-3 times a day to improve blood flow to the area and help remove the lactic acid causing the spasm.  Follow the back exercises given at discharge.  Return for reevaluation for any new or worsening symptoms.      ED Prescriptions     Medication Sig Dispense Auth. Provider   baclofen (LIORESAL) 10 MG tablet Take 1 tablet (10 mg total) by mouth 3 (  three) times daily. 30 each Margarette Canada, NP   ibuprofen (ADVIL) 600 MG tablet Take 1 tablet (600 mg total)  by mouth every 6 (six) hours as needed. 30 tablet Margarette Canada, NP      PDMP not reviewed this encounter.   Margarette Canada, NP 08/26/22 Karl Bales

## 2022-08-26 NOTE — ED Triage Notes (Signed)
Pt c/o mid to upper R back pain ongoing since today, due to heavy lifting at work, states she might have pulled a muscle.

## 2022-08-26 NOTE — Discharge Instructions (Signed)
Take the ibuprofen, 600 mg every 6 hours with food, on a schedule for the next 48 hours and then as needed.  Take the baclofen 10 mg every 8 hours, on a schedule for the next 48 hours and then as needed.  Apply moist heat to your back for 30 minutes at a time 2-3 times a day to improve blood flow to the area and help remove the lactic acid causing the spasm.  Follow the back exercises given at discharge.  Return for reevaluation for any new or worsening symptoms.  

## 2022-12-16 ENCOUNTER — Other Ambulatory Visit: Payer: Self-pay

## 2022-12-16 ENCOUNTER — Encounter (HOSPITAL_COMMUNITY): Payer: Self-pay | Admitting: *Deleted

## 2022-12-16 ENCOUNTER — Ambulatory Visit (HOSPITAL_COMMUNITY)
Admission: EM | Admit: 2022-12-16 | Discharge: 2022-12-16 | Disposition: A | Discharge status: Home / Self Care | Payer: 59 | Attending: Physician Assistant | Admitting: Physician Assistant

## 2022-12-16 DIAGNOSIS — R112 Nausea with vomiting, unspecified: Secondary | ICD-10-CM

## 2022-12-16 DIAGNOSIS — R109 Unspecified abdominal pain: Secondary | ICD-10-CM

## 2022-12-16 DIAGNOSIS — N946 Dysmenorrhea, unspecified: Secondary | ICD-10-CM | POA: Diagnosis not present

## 2022-12-16 DIAGNOSIS — R1030 Lower abdominal pain, unspecified: Secondary | ICD-10-CM

## 2022-12-16 LAB — POCT URINALYSIS DIP (MANUAL ENTRY)
Glucose, UA: NEGATIVE mg/dL
Ketones, POC UA: NEGATIVE mg/dL
Leukocytes, UA: NEGATIVE
Nitrite, UA: NEGATIVE
Protein Ur, POC: 30 mg/dL — AB
Spec Grav, UA: >=1.03 — AB (ref 1.010–1.025)
Urobilinogen, UA: 1 E.U./dL
pH, UA: 6 (ref 5.0–8.0)

## 2022-12-16 LAB — POCT URINE PREGNANCY: Preg Test, Ur: NEGATIVE

## 2022-12-16 MED ORDER — ONDANSETRON 4 MG PO TBDP
ORAL_TABLET | ORAL | Status: AC
  Filled 2022-12-16: qty 1

## 2022-12-16 MED ORDER — KETOROLAC TROMETHAMINE 60 MG/2ML IM SOLN
INTRAMUSCULAR | Status: AC
  Filled 2022-12-16: qty 2

## 2022-12-16 MED ORDER — KETOROLAC TROMETHAMINE 30 MG/ML IJ SOLN
60.0000 mg | Freq: Once | INTRAMUSCULAR | Status: AC
  Administered 2022-12-16: 60 mg via INTRAMUSCULAR

## 2022-12-16 MED ORDER — ONDANSETRON 4 MG PO TBDP
4.0000 mg | ORAL_TABLET | Freq: Once | ORAL | Status: AC
  Administered 2022-12-16: 4 mg via ORAL

## 2022-12-16 MED ORDER — KETOROLAC TROMETHAMINE 30 MG/ML IJ SOLN
INTRAMUSCULAR | Status: AC
  Filled 2022-12-16: qty 1

## 2022-12-16 NOTE — ED Triage Notes (Signed)
Pt has had ABD pain since yesterday. Pt has ABD pain associated with period . Pt has taken OTC for cramps with out relief. Pt was taking BC but Dr stopped med. Pt also has a Hx of Fibroids.

## 2022-12-16 NOTE — Discharge Instructions (Signed)
Since you throw the Zofran and have been unable to eat or drink anything I recommend you go to the emergency room for further evaluation and management particularly given the severity of your pain.

## 2022-12-16 NOTE — ED Provider Notes (Signed)
[facility]    CSN: [id number] Arrival date & time: 12/16/22  1935      History   Chief Complaint Chief Complaint  Patient presents with   Abdominal Pain    HPI Alice Lewis is a 27 y.o. female.   Patient presents today with a several hour history of severe lower abdominal pain.  She has a history of fibroids with associated dysmenorrhea.  She was previously on OCP and this significantly improved her symptoms but insurance is no longer paying for it and so she has not had it for the past 2 months.  Her menstrual cycle started today and she developed severe pain that is rated 10 on a 0-10 pain scale, described as cramping, no aggravating leaving factors identified.  She has tried Midol, Tylenol, ibuprofen with last dose several hours ago.  She is not currently followed by OB/GYN.  She has no concern for pregnancy.  Denies any urinary symptoms, vaginal discharge, pelvic discomfort.  She has had nausea and vomiting which is interfered with her ability to eat and drink today.  She denies any menorrhagia.    History reviewed. No pertinent past medical history.  There are no problems to display for this patient.   Past Surgical History:  Procedure Laterality Date   NO PAST SURGERIES      OB History   No obstetric history on file.      Home Medications    Prior to Admission medications   Medication Sig Start Date End Date Taking? Authorizing Provider  baclofen (LIORESAL) 10 MG tablet Take 1 tablet (10 mg total) by mouth 3 (three) times daily. 08/26/22   Becky Augusta, NP  ibuprofen (ADVIL) 600 MG tablet Take 1 tablet (600 mg total) by mouth every 6 (six) hours as needed. 08/26/22   Becky Augusta, NP  norgestimate-ethinyl estradiol (ORTHO-CYCLEN) 0.25-35 MG-MCG tablet Take 1 tablet by mouth daily. 07/25/21   [provider]    Family History Family History  Problem Relation Age of Onset   Stroke Mother    Healthy Father     Social History Social  History   Tobacco Use   Smoking status: Some Days    Types: Cigarettes, Cigars   Smokeless tobacco: Never  Vaping Use   Vaping Use: Former  Substance Use Topics   Alcohol use: Not Currently    Comment: Occ.   Drug use: Yes    Types: Marijuana     Allergies   Patient has no known allergies.   Review of Systems Review of Systems  Constitutional:  Positive for activity change. Negative for appetite change, fatigue and fever.  Gastrointestinal:  Positive for abdominal pain, nausea and vomiting. Negative for diarrhea.  Genitourinary:  Positive for menstrual problem and vaginal pain. Negative for dysuria, frequency, pelvic pain, vaginal bleeding and vaginal discharge.     Physical Exam Triage Vital Signs ED Triage Vitals  Enc Vitals Group     BP 12/16/22 1947 124/79     Pulse Rate 12/16/22 1947 81     Resp 12/16/22 1947 18     Temp 12/16/22 1947 98.2 F (36.8 C)     Temp src --      SpO2 12/16/22 1947 97 %     Weight --      Height --      Head Circumference --      Peak Flow --      Pain Score 12/16/22 1944 10     Pain Loc --  Pain Edu? --      Excl. in [place]? --    No data found.  Updated Vital Signs BP 124/79   Pulse 81   Temp 98.2 F (36.8 C)   Resp 18   LMP 12/16/2022   SpO2 97%   Visual Acuity Right Eye Distance:   Left Eye Distance:   Bilateral Distance:    Right Eye Near:   Left Eye Near:    Bilateral Near:     Physical Exam Vitals reviewed.  Constitutional:      General: She is awake. She is not in acute distress.    Appearance: Normal appearance. She is well-developed. She is ill-appearing.     Comments: Appears stated age clutching lower abdomen in wheelchair obviously uncomfortable  HENT:     Head: Normocephalic and atraumatic.  Cardiovascular:     Rate and Rhythm: Normal rate and regular rhythm.     Heart sounds: Normal heart sounds, S1 normal and S2 normal. No murmur heard. Pulmonary:     Effort: Pulmonary effort is normal.      Breath sounds: Normal breath sounds. No wheezing, rhonchi or rales.     Comments: Clear to auscultation bilaterally Abdominal:     General: Bowel sounds are normal.     Palpations: Abdomen is soft.     Tenderness: There is abdominal tenderness in the right lower quadrant, suprapubic area and left lower quadrant. There is no right CVA tenderness, left CVA tenderness, guarding or rebound.     Comments: Tenderness to palpation throughout lower abdomen.  Patient was unable to tolerate lying flat for exam so exam was limited.  Psychiatric:        Behavior: Behavior is cooperative.   Very pleasant   UC Treatments / Results  Labs (all labs ordered are listed, but only abnormal results are displayed) Labs Reviewed  POCT URINALYSIS DIP (MANUAL ENTRY) - Abnormal; Notable for the following components:      Result Value   Color, UA red (*)    Clarity, UA cloudy (*)    Bilirubin, UA small (*)    Spec Grav, UA >=1.030 (*)    Blood, UA moderate (*)    Protein Ur, POC =30 (*)    All other components within normal limits  POCT URINE PREGNANCY    EKG   Radiology No results found.  Procedures Procedures (including critical care time)  Medications Ordered in UC Medications  ondansetron (ZOFRAN-ODT) disintegrating tablet 4 mg (4 mg Oral Given 12/16/22 2005)  ketorolac (TORADOL) 30 MG/ML injection 60 mg (60 mg Intramuscular Given 12/16/22 2005)    Initial Impression / Assessment and Plan / UC Course  I have reviewed the triage vital signs and the nursing notes.  Pertinent labs & imaging results that were available during my care of the patient were reviewed by me and considered in my medical decision making (see chart for details).     Patient is afebrile and nontachycardic.  Given severity of her pain and obvious discomfort we initially talked about going to emergency room but she declined this.  UA was obtained that showed significant hematuria likely related to menstrual bleeding  without evidence of infection.  Urine pregnancy was negative.  Patient was given 60 of Toradol with minimal improvement of symptoms.  She was given 4 mg of Zofran ODT but ended up having recurrent nausea and vomiting with this medication.  We discussed that given she has persistent nausea and vomiting even with antiemetic medication and  has had very little oral intake I recommend she go to the emergency room.  We also discussed that given her severe symptoms she may benefit from imaging that we do not have available in urgent care though patient is confident symptoms are related to fibroids/dysmenorrhea has current symptoms are similar to previous episodes of this condition.  Ultimately patient was agreeable to going to the emergency room and was stable at the time of discharge.  Final Clinical Impressions(s) / UC Diagnoses   Final diagnoses:  Sudden onset of severe abdominal pain  Lower abdominal pain  Dysmenorrhea  Intractable nausea and vomiting     Discharge Instructions      Since you throw the Zofran and have been unable to eat or drink anything I recommend you go to the emergency room for further evaluation and management particularly given the severity of your pain.     ED Prescriptions   None    PDMP not reviewed this encounter.   Jeani Hawking, PA-C 12/16/22 2022

## 2023-01-31 ENCOUNTER — Ambulatory Visit
Admission: EM | Admit: 2023-01-31 | Discharge: 2023-01-31 | Disposition: A | Discharge status: Home / Self Care | Payer: 59 | Attending: Emergency Medicine | Admitting: Emergency Medicine

## 2023-01-31 DIAGNOSIS — H00025 Hordeolum internum left lower eyelid: Secondary | ICD-10-CM

## 2023-01-31 DIAGNOSIS — H04302 Unspecified dacryocystitis of left lacrimal passage: Secondary | ICD-10-CM

## 2023-01-31 MED ORDER — ERYTHROMYCIN 5 MG/GM OP OINT: TOPICAL_OINTMENT | OPHTHALMIC | 0 refills | Status: DC

## 2023-01-31 MED ORDER — CEFDINIR 300 MG PO CAPS: 300.0000 mg | ORAL_CAPSULE | Freq: Two times a day (BID) | ORAL | 0 refills | Status: AC

## 2023-01-31 NOTE — ED Triage Notes (Signed)
Pt present left eye puffiness with itching. Symptom started this am upon waking up.

## 2023-01-31 NOTE — ED Provider Notes (Signed)
[facility]    CSN: [id number] Arrival date & time: 01/31/23  0855      History   Chief Complaint Chief Complaint  Patient presents with   Eye Problem    HPI Alice Lewis is a 27 y.o. female.   HPI  27 year old female with no significant past medical history presents for evaluation of swelling and itching to her left eye that started this morning when she woke up.  She reports that her eye had crusting in the lashes as well as green mucopurulent discharge in the inner canthus.  She denies any changes in vision.  She also denies fever or contact with small children or others with similar symptoms.  History reviewed. No pertinent past medical history.  There are no problems to display for this patient.   Past Surgical History:  Procedure Laterality Date   NO PAST SURGERIES      OB History   No obstetric history on file.      Home Medications    Prior to Admission medications   Medication Sig Start Date End Date Taking? Authorizing Provider  cefdinir (OMNICEF) 300 MG capsule Take 1 capsule (300 mg total) by mouth 2 (two) times daily for 7 days. 01/31/23 02/07/23 Yes Becky Augusta, NP  erythromycin ophthalmic ointment Place a 1/2 inch ribbon of ointment into the lower eyelid. 01/31/23  Yes Becky Augusta, NP  baclofen (LIORESAL) 10 MG tablet Take 1 tablet (10 mg total) by mouth 3 (three) times daily. 08/26/22   Becky Augusta, NP  ibuprofen (ADVIL) 600 MG tablet Take 1 tablet (600 mg total) by mouth every 6 (six) hours as needed. 08/26/22   Becky Augusta, NP  norgestimate-ethinyl estradiol (ORTHO-CYCLEN) 0.25-35 MG-MCG tablet Take 1 tablet by mouth daily. 07/25/21   [provider]    Family History Family History  Problem Relation Age of Onset   Stroke Mother    Healthy Father     Social History Social History   Tobacco Use   Smoking status: Some Days    Types: Cigarettes, Cigars   Smokeless tobacco: Never  Vaping Use   Vaping status: Former   Substance Use Topics   Alcohol use: Not Currently    Comment: Occ.   Drug use: Yes    Types: Marijuana     Allergies   Patient has no known allergies.   Review of Systems Review of Systems  Eyes:  Positive for discharge and itching. Negative for photophobia, pain, redness and visual disturbance.  Skin:  Negative for color change.     Physical Exam Triage Vital Signs ED Triage Vitals  Encounter Vitals Group     BP      Systolic BP Percentile      Diastolic BP Percentile      Pulse      Resp      Temp      Temp src      SpO2      Weight      Height      Head Circumference      Peak Flow      Pain Score      Pain Loc      Pain Education      Exclude from Growth Chart    No data found.  Updated Vital Signs BP 105/75 (BP Location: Left Arm)   Pulse 73   Temp 98.6 F (37 C) (Oral)   Resp 16   Ht 5\' 4"  (1.626 m)  Wt 140 lb (63.5 kg)   LMP 01/13/2023   SpO2 100%   BMI 24.03 kg/m   Visual Acuity Right Eye Distance: 20/70 (Pt right eye got damage when she was 27 yrs old. pt is supposed to wear glasses but she does not.) Left Eye Distance: 20/20 Bilateral Distance:    Right Eye Near:   Left Eye Near:    Bilateral Near:     Physical Exam Vitals and nursing note reviewed.  Constitutional:      Appearance: Normal appearance. She is not ill-appearing.  HENT:     Head: Normocephalic and atraumatic.  Eyes:     General: No scleral icterus.       Left eye: Discharge present.    Extraocular Movements: Extraocular movements intact.     Pupils: Pupils are equal, round, and reactive to light.     Comments: Bulbar and labral conjunctiva of the left eye are free of injection.  There is a stye forming on the medial aspect of the left lower eyelid.  The lower eyelid and lacrimal sac to the left eye are also edematous with mild erythema and tenderness to touch.  When palpating along the path of the lacrimal sac there is some milky discharge in the inner canthus of  the left eye.  Skin:    General: Skin is warm and dry.     Capillary Refill: Capillary refill takes less than 2 seconds.     Findings: Erythema present.  Neurological:     General: No focal deficit present.     Mental Status: She is alert and oriented to person, place, and time.      UC Treatments / Results  Labs (all labs ordered are listed, but only abnormal results are displayed) Labs Reviewed - No data to display  EKG   Radiology No results found.  Procedures Procedures (including critical care time)  Medications Ordered in UC Medications - No data to display  Initial Impression / Assessment and Plan / UC Course  I have reviewed the triage vital signs and the nursing notes.  Pertinent labs & imaging results that were available during my care of the patient were reviewed by me and considered in my medical decision making (see chart for details).   Patient is a pleasant, nontoxic-appearing 55 old female presenting for evaluation of left eye complaint as outlined in HPI above.  As you can see in image above, there is edema and mild erythema along the path of the lacrimal sac of the left eye.  This is tender to palpation.  When palpating along the path of the erythema milky discharge does erupt from the inner canthus.  I suspect that the patient has dacryocystitis.  On visual inspection of the lower eyelid of the left eye a hordeolum internum was also found.  I will start the patient on cefdinir 300 mg twice daily for 7 days for treatment for dacryocystitis along with warm compresses and massage.  For the hordeolum internum I will place the patient on erythromycin eye ointment twice daily for 7 days along with vigorous eye hygiene.  I have advised the patient that if her symptoms do not improve she is to follow-up with an eye doctor.  Work note provided.  Final Clinical Impressions(s) / UC Diagnoses   Final diagnoses:  Dacryocystitis of left lacrimal sac  Hordeolum internum  of left lower eyelid     Discharge Instructions      Continue to apply warm compresses.  Perform  eyelid hygiene with baby shampoo in a rich lather and vigorous scrubbing of your eyelids twice daily. Rinse thoroughly with water.  Apply Erythromycin ointment to the eyelid margin with a clean Q-tip twice daily, and a 1/2 inch ribbon to the inside of the lower lid. Blink several times to liquify the ointment and spread it ober the inside of your eyelids.  For your infected tear duct take the Cefdinir twice daily with food for 7 days.  If your symptoms do not improve, or you develop changes in your vision follow up with your eye doctor.       ED Prescriptions     Medication Sig Dispense Auth. Provider   erythromycin ophthalmic ointment Place a 1/2 inch ribbon of ointment into the lower eyelid. 3.5 g Becky Augusta, NP   cefdinir (OMNICEF) 300 MG capsule Take 1 capsule (300 mg total) by mouth 2 (two) times daily for 7 days. 14 capsule Becky Augusta, NP      PDMP not reviewed this encounter.   Becky Augusta, NP 01/31/23 [phone]

## 2023-01-31 NOTE — Discharge Instructions (Addendum)
Continue to apply warm compresses.  Perform eyelid hygiene with baby shampoo in a rich lather and vigorous scrubbing of your eyelids twice daily. Rinse thoroughly with water.  Apply Erythromycin ointment to the eyelid margin with a clean Q-tip twice daily, and a 1/2 inch ribbon to the inside of the lower lid. Blink several times to liquify the ointment and spread it ober the inside of your eyelids.  For your infected tear duct take the Cefdinir twice daily with food for 7 days.  If your symptoms do not improve, or you develop changes in your vision follow up with your eye doctor.

## 2023-02-05 ENCOUNTER — Other Ambulatory Visit: Payer: Self-pay

## 2023-02-05 ENCOUNTER — Emergency Department
Admission: EM | Admit: 2023-02-05 | Discharge: 2023-02-05 | Disposition: A | Discharge status: Home / Self Care | Payer: 59 | Attending: Emergency Medicine | Admitting: Emergency Medicine

## 2023-02-05 ENCOUNTER — Emergency Department: Payer: 59

## 2023-02-05 DIAGNOSIS — R1030 Lower abdominal pain, unspecified: Secondary | ICD-10-CM | POA: Insufficient documentation

## 2023-02-05 DIAGNOSIS — R109 Unspecified abdominal pain: Secondary | ICD-10-CM | POA: Diagnosis not present

## 2023-02-05 DIAGNOSIS — R102 Pelvic and perineal pain: Secondary | ICD-10-CM | POA: Diagnosis not present

## 2023-02-05 LAB — CBC
HCT: 41.3 % (ref 36.0–46.0)
Hemoglobin: 13.8 g/dL (ref 12.0–15.0)
MCH: 28.8 pg (ref 26.0–34.0)
MCHC: 33.4 g/dL (ref 30.0–36.0)
MCV: 86.2 fL (ref 80.0–100.0)
Platelets: 236 10*3/uL (ref 150–400)
RBC: 4.79 MIL/uL (ref 3.87–5.11)
RDW: 14.2 % (ref 11.5–15.5)
WBC: 7.8 10*3/uL (ref 4.0–10.5)
nRBC: 0 % (ref 0.0–0.2)

## 2023-02-05 LAB — COMPREHENSIVE METABOLIC PANEL
ALT: 17 U/L (ref 0–44)
AST: 22 U/L (ref 15–41)
Albumin: 4 g/dL (ref 3.5–5.0)
Alkaline Phosphatase: 45 U/L (ref 38–126)
Anion gap: 5 (ref 5–15)
BUN: 9 mg/dL (ref 6–20)
CO2: 22 mmol/L (ref 22–32)
Calcium: 8.8 mg/dL — ABNORMAL LOW (ref 8.9–10.3)
Chloride: 108 mmol/L (ref 98–111)
Creatinine, Ser: 0.71 mg/dL (ref 0.44–1.00)
GFR, Estimated: >60 mL/min (ref >60)
Glucose, Bld: 93 mg/dL (ref 70–99)
Potassium: 3.1 mmol/L — ABNORMAL LOW (ref 3.5–5.1)
Sodium: 135 mmol/L (ref 135–145)
Total Bilirubin: 1 mg/dL (ref 0.3–1.2)
Total Protein: 7 g/dL (ref 6.5–8.1)

## 2023-02-05 LAB — HCG, QUANTITATIVE, PREGNANCY: hCG, Beta Chain, Quant, S: <1 m[IU]/mL (ref <5)

## 2023-02-05 MED ORDER — HYDROMORPHONE HCL 1 MG/ML IJ SOLN
0.5000 mg | Freq: Once | INTRAMUSCULAR | Status: AC
  Administered 2023-02-05: 0.5 mg via INTRAVENOUS
  Filled 2023-02-05: qty 0.5

## 2023-02-05 MED ORDER — HYDROCODONE-ACETAMINOPHEN 5-325 MG PO TABS: 1.0000 | ORAL_TABLET | ORAL | 0 refills | Status: DC | PRN

## 2023-02-05 MED ORDER — LACTATED RINGERS IV BOLUS
1000.0000 mL | Freq: Once | INTRAVENOUS | Status: AC
  Administered 2023-02-05: 1000 mL via INTRAVENOUS

## 2023-02-05 MED ORDER — KETOROLAC TROMETHAMINE 30 MG/ML IJ SOLN
15.0000 mg | Freq: Once | INTRAMUSCULAR | Status: AC
  Administered 2023-02-05: 15 mg via INTRAVENOUS
  Filled 2023-02-05: qty 1

## 2023-02-05 MED ORDER — ONDANSETRON HCL 4 MG/2ML IJ SOLN
4.0000 mg | Freq: Once | INTRAMUSCULAR | Status: AC
  Administered 2023-02-05: 4 mg via INTRAVENOUS
  Filled 2023-02-05: qty 2

## 2023-02-05 NOTE — ED Provider Notes (Signed)
[facility] Provider Note    Event Date/Time   First MD Initiated Contact with Patient 02/05/23 1725     (approximate)  History   Chief Complaint: Abdominal Cramping  HPI  Alice Lewis is a 27 y.o. female with no significant past medical history who presents to the emergency department for abdominal pain and cramping.  According to the patient and family member who is with the patient she has a history of significant abdominal pain during her menstrual cycles.  Patient states she was on birth control however her doctor took her off of this approximately 4 months ago did restart it today however.  Patient states over the last 2 days her menstrual cycle has been and she has been experiencing significant pain, consistent with prior episodes.  States she has been told she has ovarian cysts and uterine fibroids which are the main cause of her discomfort.  Patient states she did not have any pain while she was on birth control.  Physical Exam   Triage Vital Signs: ED Triage Vitals  Encounter Vitals Group     BP 02/05/23 1653 (!) 132/90     Systolic BP Percentile --      Diastolic BP Percentile --      Pulse Rate 02/05/23 1653 93     Resp 02/05/23 1653 20     Temp 02/05/23 1653 98.3 F (36.8 C)     Temp Source 02/05/23 1653 Oral     SpO2 02/05/23 1653 100 %     Weight 02/05/23 1652 145 lb (65.8 kg)     Height 02/05/23 1652 5\' 6"  (1.676 m)     Head Circumference --      Peak Flow --      Pain Score 02/05/23 1651 10     Pain Loc --      Pain Education --      Exclude from Growth Chart --     Most recent vital signs: Vitals:   02/05/23 1653  BP: (!) 132/90  Pulse: 93  Resp: 20  Temp: 98.3 F (36.8 C)  SpO2: 100%    General: Awake, moderate distress holding her abdomen moaning at times. CV:  Good peripheral perfusion.  Regular rate and rhythm  Resp:  Normal effort.  Equal breath sounds bilaterally.  Abd:  No distention.  Soft, diffuse  tenderness worse in the suprapubic area.  No rebound or guarding  ED Results / Procedures / Treatments   RADIOLOGY  ReSound is normal   MEDICATIONS ORDERED IN ED: Medications  ketorolac (TORADOL) 30 MG/ML injection 15 mg (has no administration in time range)  HYDROmorphone (DILAUDID) injection 0.5 mg (has no administration in time range)  lactated ringers bolus 1,000 mL (has no administration in time range)  ondansetron (ZOFRAN) injection 4 mg (has no administration in time range)     IMPRESSION / MDM / ASSESSMENT AND PLAN / ED COURSE  I reviewed the triage vital signs and the nursing notes.  Patient's presentation is most consistent with acute presentation with potential threat to life or bodily function.  Patient presents emergency department for abdominal pain fairly diffusely but more so in the lower abdomen.  Patient did start her menstrual cycle which she states causes her significant abdominal pain due to fibroids and cysts.  Patient states a history of the same in the past however it resolved when she was on birth control.  Her birth control was stopped 4 months ago, she was able to  call her doctor and they restarted it today but the patient was in significant pain so they came to the emergency department for evaluation.  We will treat the patient's pain and IV hydrate we will check labs and a pelvic ultrasound continue to closely monitor.  Patient agreeable to plan of care and workup.  Patient's ultrasound is normal, CBC is normal chemistry is normal pregnancy test is negative.  Patient is feeling much better states she is ready to go home.  Denies any urinary symptoms does not want to wait for urinalysis.  Will discharge patient home with a short course of pain medication have the patient follow-up with her OB/GYN.  Provided my typical return precautions.  FINAL CLINICAL IMPRESSION(S) / ED DIAGNOSES   Abdominal pain  Note:  This document was prepared using Dragon voice  recognition software and may include unintentional dictation errors.   Minna Antis, MD 02/05/23 [phone]

## 2023-02-05 NOTE — ED Triage Notes (Signed)
Patient states she started her period last night and having intense period cramps, states this happens during every cycle.

## 2023-03-13 ENCOUNTER — Ambulatory Visit
Admission: EM | Admit: 2023-03-13 | Discharge: 2023-03-13 | Disposition: A | Discharge status: Home / Self Care | Payer: 59 | Attending: Internal Medicine | Admitting: Internal Medicine

## 2023-03-13 DIAGNOSIS — B349 Viral infection, unspecified: Secondary | ICD-10-CM | POA: Diagnosis not present

## 2023-03-13 LAB — CBC WITH DIFFERENTIAL/PLATELET
Abs Immature Granulocytes: 0.01 10*3/uL (ref 0.00–0.07)
Basophils Absolute: 0 10*3/uL (ref 0.0–0.1)
Basophils Relative: 1 %
Eosinophils Absolute: 0.2 10*3/uL (ref 0.0–0.5)
Eosinophils Relative: 3 %
HCT: 41 % (ref 36.0–46.0)
Hemoglobin: 13.6 g/dL (ref 12.0–15.0)
Immature Granulocytes: 0 %
Lymphocytes Relative: 39 %
Lymphs Abs: 2.5 10*3/uL (ref 0.7–4.0)
MCH: 28.9 pg (ref 26.0–34.0)
MCHC: 33.2 g/dL (ref 30.0–36.0)
MCV: 87 fL (ref 80.0–100.0)
Monocytes Absolute: 0.5 10*3/uL (ref 0.1–1.0)
Monocytes Relative: 8 %
Neutro Abs: 3.3 10*3/uL (ref 1.7–7.7)
Neutrophils Relative %: 49 %
Platelets: 252 10*3/uL (ref 150–400)
RBC: 4.71 MIL/uL (ref 3.87–5.11)
RDW: 14.6 % (ref 11.5–15.5)
WBC: 6.5 10*3/uL (ref 4.0–10.5)
nRBC: 0 % (ref 0.0–0.2)

## 2023-03-13 LAB — BASIC METABOLIC PANEL
Anion gap: 7 (ref 5–15)
BUN: 11 mg/dL (ref 6–20)
CO2: 23 mmol/L (ref 22–32)
Calcium: 9 mg/dL (ref 8.9–10.3)
Chloride: 103 mmol/L (ref 98–111)
Creatinine, Ser: 0.83 mg/dL (ref 0.44–1.00)
GFR, Estimated: >60 mL/min (ref >60)
Glucose, Bld: 91 mg/dL (ref 70–99)
Potassium: 4.2 mmol/L (ref 3.5–5.1)
Sodium: 133 mmol/L — ABNORMAL LOW (ref 135–145)

## 2023-03-13 LAB — VITAMIN D 25 HYDROXY (VIT D DEFICIENCY, FRACTURES): Vit D, 25-Hydroxy: 19.78 ng/mL — ABNORMAL LOW (ref 30–100)

## 2023-03-13 MED ORDER — SODIUM CHLORIDE 0.9 % IV BOLUS
1000.0000 mL | Freq: Once | INTRAVENOUS | Status: AC
  Administered 2023-03-13: 1000 mL via INTRAVENOUS

## 2023-03-13 NOTE — ED Triage Notes (Signed)
Pt c/o loss of balance, dizziness, nausea x1day  Pt states that it feels like she has a weight over her eyes that is trying to push her head down.   Pt denies any cough, congestion, new exercises, or physical activity.

## 2023-03-13 NOTE — ED Provider Notes (Signed)
[facility]    CSN: [id number] Arrival date & time: 03/13/23  1147      History   Chief Complaint Chief Complaint  Patient presents with   Dizziness   Nausea    HPI Dayshia Goldsborough is a 27 y.o. female comes to the urgent care with 1 day history of generalized weakness, dizziness and nausea without vomiting.  Patient symptoms started fairly abruptly and has been persistent.  No fever or chills.  No nausea or vomiting.  No sick contacts.  Patient denies any runny nose, sneezing, cough or sputum production.  No rashes on the skin.  No alcohol or illicit drug use.  Patient has no medical problems and does not take any medications routinely.Marland Kitchen   HPI  History reviewed. No pertinent past medical history.  There are no problems to display for this patient.   Past Surgical History:  Procedure Laterality Date   NO PAST SURGERIES      OB History   No obstetric history on file.      Home Medications    Prior to Admission medications   Medication Sig Start Date End Date Taking? Authorizing Provider  baclofen (LIORESAL) 10 MG tablet Take 1 tablet (10 mg total) by mouth 3 (three) times daily. 08/26/22   Becky Augusta, NP  norgestimate-ethinyl estradiol (ORTHO-CYCLEN) 0.25-35 MG-MCG tablet Take 1 tablet by mouth daily. 07/25/21   [provider]    Family History Family History  Problem Relation Age of Onset   Stroke Mother    Healthy Father     Social History Social History   Tobacco Use   Smoking status: Some Days    Types: Cigarettes, Cigars   Smokeless tobacco: Never  Vaping Use   Vaping status: Former  Substance Use Topics   Alcohol use: Not Currently    Comment: Occ.   Drug use: Not Currently    Types: Marijuana     Allergies   Patient has no known allergies.   Review of Systems Review of Systems As per HPI  Physical Exam Triage Vital Signs ED Triage Vitals  Encounter Vitals Group     BP 03/13/23 1207 103/80     Systolic BP  Percentile --      Diastolic BP Percentile --      Pulse Rate 03/13/23 1207 72     Resp --      Temp 03/13/23 1207 98.1 F (36.7 C)     Temp Source 03/13/23 1207 Oral     SpO2 03/13/23 1207 100 %     Weight 03/13/23 1206 140 lb (63.5 kg)     Height 03/13/23 1206 5\' 6"  (1.676 m)     Head Circumference --      Peak Flow --      Pain Score 03/13/23 1205 8     Pain Loc --      Pain Education --      Exclude from Growth Chart --    No data found.  Updated Vital Signs BP 103/80 (BP Location: Left Arm)   Pulse 72   Temp 98.1 F (36.7 C) (Oral)   Ht 5\' 6"  (1.676 m)   Wt 63.5 kg   LMP 02/10/2023   SpO2 100%   BMI 22.60 kg/m   Visual Acuity Right Eye Distance:   Left Eye Distance:   Bilateral Distance:    Right Eye Near:   Left Eye Near:    Bilateral Near:     Physical Exam Vitals  and nursing note reviewed.  Constitutional:      Appearance: She is ill-appearing.  HENT:     Right Ear: Tympanic membrane normal.     Left Ear: Tympanic membrane normal.     Mouth/Throat:     Mouth: Mucous membranes are moist.     Pharynx: No posterior oropharyngeal erythema.  Eyes:     Pupils: Pupils are equal, round, and reactive to light.  Cardiovascular:     Rate and Rhythm: Normal rate and regular rhythm.     Pulses: Normal pulses.     Heart sounds: Normal heart sounds.  Pulmonary:     Effort: Pulmonary effort is normal.     Breath sounds: Normal breath sounds.  Abdominal:     General: Bowel sounds are normal.     Palpations: Abdomen is soft.  Neurological:     Mental Status: She is alert.      UC Treatments / Results  Labs (all labs ordered are listed, but only abnormal results are displayed) Labs Reviewed  BASIC METABOLIC PANEL - Abnormal; Notable for the following components:      Result Value   Sodium 133 (*)    All other components within normal limits  CBC WITH DIFFERENTIAL/PLATELET  VITAMIN D 25 HYDROXY (VIT D DEFICIENCY, FRACTURES)    EKG   Radiology No  results found.  Procedures Procedures (including critical care time)  Medications Ordered in UC Medications  sodium chloride 0.9 % bolus 1,000 mL (1,000 mLs Intravenous New Bag/Given 03/13/23 1310)    Initial Impression / Assessment and Plan / UC Course  I have reviewed the triage vital signs and the nursing notes.  Pertinent labs & imaging results that were available during my care of the patient were reviewed by me and considered in my medical decision making (see chart for details).     1.  Acute viral illness: Normal saline-1 L bolus CBC: BMP, vitamin D level No indication for COVID testing. Patient is advised to follow-up with primary care physician or return to the urgent care if symptoms worsen. Final Clinical Impressions(s) / UC Diagnoses   Final diagnoses:  Viral illness     Discharge Instructions      Please maintain adequate fluid intake Tylenol or ibuprofen as needed for pain and/or fever Will call you with recommendations if labs abnormal Your symptoms are likely secondary to viral illness Please return to urgent care if you have any other concerns.   ED Prescriptions   None    PDMP not reviewed this encounter.   Merrilee Jansky, MD 03/13/23 [phone]

## 2023-03-13 NOTE — Discharge Instructions (Signed)
Please maintain adequate fluid intake Tylenol or ibuprofen as needed for pain and/or fever Will call you with recommendations if labs abnormal Your symptoms are likely secondary to viral illness Please return to urgent care if you have any other concerns.

## 2023-03-23 ENCOUNTER — Ambulatory Visit
Admission: EM | Admit: 2023-03-23 | Discharge: 2023-03-23 | Disposition: A | Discharge status: Home / Self Care | Payer: 59 | Attending: Emergency Medicine | Admitting: Emergency Medicine

## 2023-03-23 DIAGNOSIS — K0889 Other specified disorders of teeth and supporting structures: Secondary | ICD-10-CM | POA: Diagnosis not present

## 2023-03-23 MED ORDER — IBUPROFEN 800 MG PO TABS: 800.0000 mg | ORAL_TABLET | Freq: Three times a day (TID) | ORAL | 0 refills | Status: DC

## 2023-03-23 MED ORDER — AMOXICILLIN-POT CLAVULANATE 875-125 MG PO TABS: 1.0000 | ORAL_TABLET | Freq: Two times a day (BID) | ORAL | 0 refills | Status: DC

## 2023-03-23 MED ORDER — ACETAMINOPHEN-CODEINE 300-30 MG PO TABS: 1.0000 | ORAL_TABLET | Freq: Four times a day (QID) | ORAL | 0 refills | Status: DC | PRN

## 2023-03-23 NOTE — ED Provider Notes (Signed)
[facility]    CSN: [id number] Arrival date & time: 03/23/23  0909      History   Chief Complaint Chief Complaint  Patient presents with   Dental Pain    HPI Alice Lewis is a 27 y.o. female.   Patient presents for evaluation of dental pain beginning 2 days ago after tooth extraction to the left upper gumline by [facility].  Endorses 10 out of 10, interfering with sleep and day-to-day activity.  Has attempted use of Tylenol which has been ineffective.  Tolerating some food and liquids but painful to chew.  Endorses she was given no medicine after procedure.  Denies presence of drainage or fever.   History reviewed. No pertinent past medical history.  There are no problems to display for this patient.   Past Surgical History:  Procedure Laterality Date   NO PAST SURGERIES      OB History   No obstetric history on file.      Home Medications    Prior to Admission medications   Medication Sig Start Date End Date Taking? Authorizing Provider  baclofen (LIORESAL) 10 MG tablet Take 1 tablet (10 mg total) by mouth 3 (three) times daily. 08/26/22  Yes Becky Augusta, NP  norgestimate-ethinyl estradiol (ORTHO-CYCLEN) 0.25-35 MG-MCG tablet Take 1 tablet by mouth daily. 07/25/21  Yes [provider]    Family History Family History  Problem Relation Age of Onset   Stroke Mother    Healthy Father     Social History Social History   Tobacco Use   Smoking status: Some Days    Types: Cigarettes, Cigars   Smokeless tobacco: Never  Vaping Use   Vaping status: Former  Substance Use Topics   Alcohol use: Not Currently    Comment: Occ.   Drug use: Not Currently    Types: Marijuana     Allergies   Patient has no known allergies.   Review of Systems Review of Systems   Physical Exam Triage Vital Signs ED Triage Vitals  Encounter Vitals Group     BP 03/23/23 0940 (!) 113/91     Systolic BP Percentile --      Diastolic  BP Percentile --      Pulse Rate 03/23/23 0940 79     Resp --      Temp 03/23/23 0940 98.5 F (36.9 C)     Temp Source 03/23/23 0940 Oral     SpO2 03/23/23 0940 100 %     Weight 03/23/23 0939 140 lb (63.5 kg)     Height 03/23/23 0939 5\' 6"  (1.676 m)     Head Circumference --      Peak Flow --      Pain Score 03/23/23 0939 10     Pain Loc --      Pain Education --      Exclude from Growth Chart --    No data found.  Updated Vital Signs BP (!) 113/91 (BP Location: Left Arm)   Pulse 79   Temp 98.5 F (36.9 C) (Oral)   Ht 5\' 6"  (1.676 m)   Wt 140 lb (63.5 kg)   LMP 02/10/2023   SpO2 100%   BMI 22.60 kg/m   Visual Acuity Right Eye Distance:   Left Eye Distance:   Bilateral Distance:    Right Eye Near:   Left Eye Near:    Bilateral Near:     Physical Exam Constitutional:      Appearance:  Normal appearance.  HENT:     Head: Normocephalic.     Mouth/Throat:      Comments: Missing tooth along the left upper gumline without drainage, no abscess noted Neurological:     Mental Status: She is alert.      UC Treatments / Results  Labs (all labs ordered are listed, but only abnormal results are displayed) Labs Reviewed - No data to display  EKG   Radiology No results found.  Procedures Procedures (including critical care time)  Medications Ordered in UC Medications - No data to display  Initial Impression / Assessment and Plan / UC Course  I have reviewed the triage vital signs and the nursing notes.  Pertinent labs & imaging results that were available during my care of the patient were reviewed by me and considered in my medical decision making (see chart for details).  Dental pain  There is a extracted tooth along the left upper gumline, patient endorsing severe pain, in fetal position throughout entirety of exam, we will prophylactically provide infection as dental procedure just completed, Augmentin sent to pharmacy for management of pain prescribed  ibuprofen 800 mg and Tylenol 3, PDMP reviewed, low risk, advise increase fluid intake until able to tolerate food at baseline to maintain hydration, advised notification of dentist as soon as office reopens if pain continues to persist for reevaluation and further management Final Clinical Impressions(s) / UC Diagnoses   Final diagnoses:  None     Discharge Instructions      Today you have been evaluated for your dental pain  Given prescribed ibuprofen which helps to reduce inflammation May use every 8 hours as needed  You have been prescribed Tylenol with codeine which you may use every 8 hours, please be mindful this medicine will make you feel drowsy  Please follow all postsurgical instructions given by dentist  If you continue to have dental pain please reach out to your surgeon whenever their office reopens   ED Prescriptions   None    I have reviewed the PDMP during this encounter.   Valinda Hoar, NP 03/23/23 1201

## 2023-03-23 NOTE — Discharge Instructions (Addendum)
Today you have been evaluated for your dental pain  As your tooth was just removed you will be prophylactically placed on antibiotic to provide coverage for infection, take Augmentin every morning and every evening for 7 days  Given prescribed ibuprofen which helps to reduce inflammation May use every 8 hours as needed  You have been prescribed Tylenol with codeine which is a strong pain medicine which you may use every 8 hours, please be mindful this medicine will make you feel drowsy  Please follow all postsurgical instructions given by dentist  If you continue to have dental pain please reach out to your surgeon whenever their office reopens

## 2023-03-23 NOTE — ED Triage Notes (Signed)
Pt c/o dental pain x1day  Pt states that she saw her dentist on Friday and had a tooth pulled. Pt states that she did not have any medication sent in for the pain.   Pt took 2 tylenols last night at 9pm, 1am, and 3am for pain

## 2023-03-28 DIAGNOSIS — S199XXA Unspecified injury of neck, initial encounter: Secondary | ICD-10-CM | POA: Diagnosis not present

## 2023-03-28 DIAGNOSIS — T71194A Asphyxiation due to mechanical threat to breathing due to other causes, undetermined, initial encounter: Secondary | ICD-10-CM | POA: Diagnosis not present

## 2023-05-19 ENCOUNTER — Ambulatory Visit
Admission: EM | Admit: 2023-05-19 | Discharge: 2023-05-19 | Disposition: A | Discharge status: Home / Self Care | Payer: 59 | Attending: Family Medicine | Admitting: Family Medicine

## 2023-05-19 ENCOUNTER — Ambulatory Visit: Payer: 59

## 2023-05-19 ENCOUNTER — Encounter: Payer: Self-pay | Admitting: Internal Medicine

## 2023-05-19 DIAGNOSIS — R0781 Pleurodynia: Secondary | ICD-10-CM | POA: Diagnosis not present

## 2023-05-19 DIAGNOSIS — R109 Unspecified abdominal pain: Secondary | ICD-10-CM | POA: Diagnosis not present

## 2023-05-19 HISTORY — DX: Unspecified asthma, uncomplicated: J45.909

## 2023-05-19 MED ORDER — NAPROXEN 500 MG PO TABS: 500.0000 mg | ORAL_TABLET | Freq: Two times a day (BID) | ORAL | 0 refills | Status: DC

## 2023-05-19 MED ORDER — CYCLOBENZAPRINE HCL 10 MG PO TABS: 10.0000 mg | ORAL_TABLET | Freq: Three times a day (TID) | ORAL | 0 refills | Status: DC | PRN

## 2023-05-19 NOTE — ED Provider Notes (Signed)
[facility]    CSN: [id number] Arrival date & time: 05/19/23  1723      History   Chief Complaint Chief Complaint  Patient presents with   Back Pain    HPI  HPI Alice Lewis is a 27 y.o. female.   Alice Lewis presents for right upper back pain that radiates to right flank started Sunday.  Feels like a strain. Pain worse with deep breathing.  Works at [employer]. No prior history of back injury or pain.  No fever, chills, numbness, tingling, changes to bowel bladder habits, URI symptoms, bruising or discoloration in the area.  No treatments prior to arrival.  She has history of asthma but not needed to use her inhaler today but needed it on Saturday.       Past Medical History:  Diagnosis Date   Asthma     There are no problems to display for this patient.   Past Surgical History:  Procedure Laterality Date   NO PAST SURGERIES      OB History   No obstetric history on file.      Home Medications    Prior to Admission medications   Medication Sig Start Date End Date Taking? Authorizing Provider  cyclobenzaprine (FLEXERIL) 10 MG tablet Take 1 tablet (10 mg total) by mouth 3 (three) times daily as needed for muscle spasms. 05/19/23  Yes Brennan Karam, DO  naproxen (NAPROSYN) 500 MG tablet Take 1 tablet (500 mg total) by mouth 2 (two) times daily with a meal. 05/19/23  Yes Grady Lucci, DO  norgestimate-ethinyl estradiol (ORTHO-CYCLEN) 0.25-35 MG-MCG tablet Take 1 tablet by mouth daily. 07/25/21  Yes [provider]  acetaminophen-codeine (TYLENOL #3) 300-30 MG tablet Take 1 tablet by mouth every 6 (six) hours as needed for moderate pain. 03/23/23   Valinda Hoar, NP  amoxicillin-clavulanate (AUGMENTIN) 875-125 MG tablet Take 1 tablet by mouth every 12 (twelve) hours. 03/23/23   White, Elita Boone, NP  baclofen (LIORESAL) 10 MG tablet Take 1 tablet (10 mg total) by mouth 3 (three) times daily. 08/26/22   Becky Augusta, NP    Family  History Family History  Problem Relation Age of Onset   Stroke Mother    Healthy Father     Social History Social History   Tobacco Use   Smoking status: Some Days    Types: Cigarettes, Cigars   Smokeless tobacco: Never  Vaping Use   Vaping status: Former  Substance Use Topics   Alcohol use: Not Currently    Comment: Occ.   Drug use: Not Currently    Types: Marijuana     Allergies   Patient has no known allergies.   Review of Systems Review of Systems: egative unless otherwise stated in HPI.      Physical Exam Triage Vital Signs ED Triage Vitals  Encounter Vitals Group     BP 05/19/23 1824 113/73     Systolic BP Percentile --      Diastolic BP Percentile --      Pulse Rate 05/19/23 1824 85     Resp 05/19/23 1824 17     Temp 05/19/23 1824 98.2 F (36.8 C)     Temp Source 05/19/23 1824 Oral     SpO2 05/19/23 1824 98 %     Weight --      Height --      Head Circumference --      Peak Flow --      Pain Score 05/19/23 1823  7     Pain Loc --      Pain Education --      Exclude from Growth Chart --    No data found.  Updated Vital Signs BP 113/73 (BP Location: Right Arm)   Pulse 85   Temp 98.2 F (36.8 C) (Oral)   Resp 17   LMP 04/23/2023 (Approximate)   SpO2 98%   Visual Acuity Right Eye Distance:   Left Eye Distance:   Bilateral Distance:    Right Eye Near:   Left Eye Near:    Bilateral Near:     Physical Exam GEN: well appearing female in no acute distress  CVS: well perfused, regular rate and rhythm CHEST WALL: no deformity, step offs or overlying skin changes  RESP: speaking in full sentences without pause, no respiratory distress, clear  MSK:  Spine: - Inspection: no gross deformity or asymmetry, swelling or ecchymosis. No skin changes  - Palpation: No TTP over the spinous processes, right mid thoracic paraspinal muscle hypertonicity with lateral rib tenderness to palpation, no SI joint tenderness bilaterally - ROM: full active ROM of  the spine in flexion, extension and rotation - Strength: 5/5 - Neuro: sensation intact  SKIN: warm, dry, no overly skin rash or erythema    UC Treatments / Results  Labs (all labs ordered are listed, but only abnormal results are displayed) Labs Reviewed - No data to display  EKG   Radiology DG Ribs Unilateral W/Chest Right  Result Date: 05/19/2023 CLINICAL DATA:  Pain EXAM: RIGHT RIBS AND CHEST - 3+ VIEW COMPARISON:  None Available. FINDINGS: No fracture or other bone lesions are seen involving the ribs. There is no evidence of pneumothorax or pleural effusion. Both lungs are clear. Heart size and mediastinal contours are within normal limits. IMPRESSION: Negative. Electronically Signed   By: Darliss Cheney M.D.   On: 05/19/2023 22:22     Procedures Procedures (including critical care time)  Medications Ordered in UC Medications - No data to display  Initial Impression / Assessment and Plan / UC Course  I have reviewed the triage vital signs and the nursing notes.  Pertinent labs & imaging results that were available during my care of the patient were reviewed by me and considered in my medical decision making (see chart for details).      Pt is a 27 y.o.  female with right upper back and side pain for the past day.  Obtained right ribs and chest plain films.  Xray personally interpreted by me were focal pneumonia, rib fracture, pleural effusion, unremarkable for fracture, or significant malalignment.  Patient aware the radiologist has not read her xray and is comfortable with the preliminary read by me. Will review radiologist read when available and call patient if a change in plan is warranted.  Pt agreeable to this plan prior to discharge.    Patient to gradually return to normal activities, as tolerated and continue ordinary activities within the limits permitted by pain. Prescribed Naproxen sodium  and muscle relaxer  for pain relief.  Advised patient to avoid other NSAIDs  while taking Naprosyn. Tylenol and Lidocaine patches PRN for multimodal pain relief. Counseled patient on red flag symptoms and when to seek immediate care. Patient to follow up with orthopedic provider if symptoms do not improve with conservative treatment.  Return and ED precautions given.    Discussed MDM, treatment plan and plan for follow-up with patient who agrees with plan.   Final Clinical Impressions(s) /  UC Diagnoses   Final diagnoses:  Flank pain, acute     Discharge Instructions      If medication was prescribed, stop by the pharmacy to pick up your prescriptions.  For your  pain, Take 1500 mg Tylenol twice a day, take muscle relaxer Flexeril and Naprosyn as needed for pain. Rest and elevate the affected painful area.  Apply warm compresses intermittently, as needed.  As pain recedes, begin normal activities slowly as tolerated.  Follow up with primary care provider or an orthopedic provider, if symptoms persist.  Watch for worsening symptoms such as an increasing weakness or loss of sensation, increasing pain and/or the loss of bladder or bowel function. Should any of these occur, go to the emergency department immediately.        ED Prescriptions     Medication Sig Dispense Auth. Provider   naproxen (NAPROSYN) 500 MG tablet Take 1 tablet (500 mg total) by mouth 2 (two) times daily with a meal. 30 tablet Dasean Brow, DO   cyclobenzaprine (FLEXERIL) 10 MG tablet Take 1 tablet (10 mg total) by mouth 3 (three) times daily as needed for muscle spasms. 30 tablet Katha Cabal, DO      PDMP not reviewed this encounter.   Katha Cabal, DO 05/23/23 [phone]

## 2023-05-19 NOTE — Discharge Instructions (Signed)
If medication was prescribed, stop by the pharmacy to pick up your prescriptions.  For your  pain, Take 1500 mg Tylenol twice a day, take muscle relaxer Flexeril and Naprosyn as needed for pain. Rest and elevate the affected painful area.  Apply warm compresses intermittently, as needed.  As pain recedes, begin normal activities slowly as tolerated.  Follow up with primary care provider or an orthopedic provider, if symptoms persist.  Watch for worsening symptoms such as an increasing weakness or loss of sensation, increasing pain and/or the loss of bladder or bowel function. Should any of these occur, go to the emergency department immediately.

## 2023-05-19 NOTE — ED Triage Notes (Signed)
Patient states that she's having upper right side pain that radiates to her back. Hx of asthma.

## 2023-05-28 ENCOUNTER — Ambulatory Visit: Payer: 59 | Admitting: Internal Medicine

## 2023-08-19 ENCOUNTER — Other Ambulatory Visit: Payer: Self-pay

## 2023-08-19 ENCOUNTER — Encounter: Payer: Self-pay | Admitting: Intensive Care

## 2023-08-19 ENCOUNTER — Emergency Department
Admission: EM | Admit: 2023-08-19 | Discharge: 2023-08-19 | Discharge status: Against Medical Advice | Payer: 59 | Attending: Student in an Organized Health Care Education/Training Program | Admitting: Student in an Organized Health Care Education/Training Program

## 2023-08-19 DIAGNOSIS — Z5321 Procedure and treatment not carried out due to patient leaving prior to being seen by health care provider: Secondary | ICD-10-CM | POA: Insufficient documentation

## 2023-08-19 DIAGNOSIS — Z20822 Contact with and (suspected) exposure to covid-19: Secondary | ICD-10-CM | POA: Diagnosis not present

## 2023-08-19 DIAGNOSIS — R109 Unspecified abdominal pain: Secondary | ICD-10-CM | POA: Diagnosis not present

## 2023-08-19 DIAGNOSIS — R1110 Vomiting, unspecified: Secondary | ICD-10-CM | POA: Insufficient documentation

## 2023-08-19 DIAGNOSIS — R197 Diarrhea, unspecified: Secondary | ICD-10-CM | POA: Diagnosis not present

## 2023-08-19 DIAGNOSIS — R112 Nausea with vomiting, unspecified: Secondary | ICD-10-CM | POA: Diagnosis not present

## 2023-08-19 LAB — URINALYSIS, ROUTINE W REFLEX MICROSCOPIC
Bilirubin Urine: NEGATIVE
Glucose, UA: NEGATIVE mg/dL
Ketones, ur: NEGATIVE mg/dL
Leukocytes,Ua: NEGATIVE
Nitrite: NEGATIVE
Protein, ur: 30 mg/dL — AB
Specific Gravity, Urine: 1.033 — ABNORMAL HIGH (ref 1.005–1.030)
pH: 5 (ref 5.0–8.0)

## 2023-08-19 LAB — COMPREHENSIVE METABOLIC PANEL WITH GFR
ALT: 15 U/L (ref 0–44)
AST: 20 U/L (ref 15–41)
Albumin: 4.6 g/dL (ref 3.5–5.0)
Alkaline Phosphatase: 56 U/L (ref 38–126)
Anion gap: 15 (ref 5–15)
BUN: 13 mg/dL (ref 6–20)
CO2: 23 mmol/L (ref 22–32)
Calcium: 9.7 mg/dL (ref 8.9–10.3)
Chloride: 100 mmol/L (ref 98–111)
Creatinine, Ser: 0.81 mg/dL (ref 0.44–1.00)
GFR, Estimated: >60 mL/min
Glucose, Bld: 97 mg/dL (ref 70–99)
Potassium: 3.9 mmol/L (ref 3.5–5.1)
Sodium: 138 mmol/L (ref 135–145)
Total Bilirubin: 1.1 mg/dL (ref 0.0–1.2)
Total Protein: 7.4 g/dL (ref 6.5–8.1)

## 2023-08-19 LAB — CBC
HCT: 42.8 % (ref 36.0–46.0)
Hemoglobin: 14.3 g/dL (ref 12.0–15.0)
MCH: 28.5 pg (ref 26.0–34.0)
MCHC: 33.4 g/dL (ref 30.0–36.0)
MCV: 85.4 fL (ref 80.0–100.0)
Platelets: 266 10*3/uL (ref 150–400)
RBC: 5.01 MIL/uL (ref 3.87–5.11)
RDW: 13.5 % (ref 11.5–15.5)
WBC: 7.1 10*3/uL (ref 4.0–10.5)
nRBC: 0 % (ref 0.0–0.2)

## 2023-08-19 LAB — RESP PANEL BY RT-PCR (RSV, FLU A&B, COVID)  RVPGX2
Influenza A by PCR: NEGATIVE
Influenza B by PCR: NEGATIVE
Resp Syncytial Virus by PCR: NEGATIVE
SARS Coronavirus 2 by RT PCR: NEGATIVE

## 2023-08-19 LAB — PREGNANCY, URINE: Preg Test, Ur: NEGATIVE

## 2023-08-19 LAB — LIPASE, BLOOD: Lipase: 26 U/L (ref 11–51)

## 2023-08-19 NOTE — ED Provider Triage Note (Signed)
Emergency Medicine Provider Triage Evaluation Note  Alice Lewis , a 28 y.o. female  was evaluated in triage.  Pt complains of abd pain with vomiting and diarrhea.  Review of Systems  Positive: +vomiting and diarrhea Negative:   Physical Exam  Ht 5\' 6"  (1.676 m)   Wt 60.3 kg   LMP 08/18/2023   BMI 21.47 kg/m  Gen:   Awake, no distress   Resp:  Normal effort  MSK:   Moves extremities without difficulty  Other:    Medical Decision Making  Medically screening exam initiated at 1:53 PM.  Appropriate orders placed.  Alice Lewis was informed that the remainder of the evaluation will be completed by another provider, this initial triage assessment does not replace that evaluation, and the importance of remaining in the ED until their evaluation is complete.     Tommi Rumps, PA-C 08/19/23 1354

## 2023-08-19 NOTE — ED Triage Notes (Signed)
First nurse note:Brought over from [facility]. C/o abdominal pain since yesterday with N/V/D.   Recently stopped birth control and having heavy vaginal bleeding that started yesterday  History of ovarian cyst.   EMS vitals: 101/67 b/p 77HR 95% RA 98.4oral

## 2023-08-19 NOTE — ED Notes (Signed)
No answer when called several times from lobby

## 2023-09-16 ENCOUNTER — Emergency Department
Admission: EM | Admit: 2023-09-16 | Discharge: 2023-09-16 | Disposition: A | Discharge status: Home / Self Care | Payer: 59 | Attending: Emergency Medicine | Admitting: Emergency Medicine

## 2023-09-16 ENCOUNTER — Other Ambulatory Visit: Payer: Self-pay

## 2023-09-16 ENCOUNTER — Emergency Department: Payer: 59

## 2023-09-16 DIAGNOSIS — N946 Dysmenorrhea, unspecified: Secondary | ICD-10-CM | POA: Diagnosis not present

## 2023-09-16 DIAGNOSIS — R109 Unspecified abdominal pain: Secondary | ICD-10-CM | POA: Diagnosis present

## 2023-09-16 HISTORY — DX: Unspecified ovarian cyst, unspecified side: N83.209

## 2023-09-16 LAB — URINALYSIS, ROUTINE W REFLEX MICROSCOPIC
Bacteria, UA: NONE SEEN
Bilirubin Urine: NEGATIVE
Glucose, UA: NEGATIVE mg/dL
Ketones, ur: NEGATIVE mg/dL
Leukocytes,Ua: NEGATIVE
Nitrite: NEGATIVE
Protein, ur: 30 mg/dL — AB
Specific Gravity, Urine: 1.028 (ref 1.005–1.030)
pH: 5 (ref 5.0–8.0)

## 2023-09-16 LAB — CBC
HCT: 40.2 % (ref 36.0–46.0)
Hemoglobin: 13.3 g/dL (ref 12.0–15.0)
MCH: 28.2 pg (ref 26.0–34.0)
MCHC: 33.1 g/dL (ref 30.0–36.0)
MCV: 85.4 fL (ref 80.0–100.0)
Platelets: 253 10*3/uL (ref 150–400)
RBC: 4.71 MIL/uL (ref 3.87–5.11)
RDW: 13.2 % (ref 11.5–15.5)
WBC: 6.1 10*3/uL (ref 4.0–10.5)
nRBC: 0 % (ref 0.0–0.2)

## 2023-09-16 LAB — COMPREHENSIVE METABOLIC PANEL
ALT: 15 U/L (ref 0–44)
AST: 17 U/L (ref 15–41)
Albumin: 3.9 g/dL (ref 3.5–5.0)
Alkaline Phosphatase: 51 U/L (ref 38–126)
Anion gap: 8 (ref 5–15)
BUN: 11 mg/dL (ref 6–20)
CO2: 23 mmol/L (ref 22–32)
Calcium: 9.2 mg/dL (ref 8.9–10.3)
Chloride: 106 mmol/L (ref 98–111)
Creatinine, Ser: 0.76 mg/dL (ref 0.44–1.00)
GFR, Estimated: >60 mL/min (ref >60)
Glucose, Bld: 92 mg/dL (ref 70–99)
Potassium: 3.9 mmol/L (ref 3.5–5.1)
Sodium: 137 mmol/L (ref 135–145)
Total Bilirubin: 1.2 mg/dL (ref 0.0–1.2)
Total Protein: 6.7 g/dL (ref 6.5–8.1)

## 2023-09-16 LAB — LIPASE, BLOOD: Lipase: 28 U/L (ref 11–51)

## 2023-09-16 LAB — POC URINE PREG, ED: Preg Test, Ur: NEGATIVE

## 2023-09-16 MED ORDER — NORGESTIMATE-ETH ESTRADIOL 0.18/0.215/0.25 MG-25 MCG PO TABS: 1.0000 | ORAL_TABLET | Freq: Every day | ORAL | 11 refills | Status: AC

## 2023-09-16 MED ORDER — CYCLOBENZAPRINE HCL 10 MG PO TABS: 10.0000 mg | ORAL_TABLET | Freq: Three times a day (TID) | ORAL | 0 refills | Status: AC | PRN

## 2023-09-16 MED ORDER — KETOROLAC TROMETHAMINE 30 MG/ML IJ SOLN
60.0000 mg | Freq: Once | INTRAMUSCULAR | Status: AC
  Administered 2023-09-16: 60 mg via INTRAMUSCULAR
  Filled 2023-09-16: qty 2

## 2023-09-16 NOTE — ED Notes (Signed)
 Pt to ED 43--On assessment, pt is endorsing generalized pain in the lower abdomen r/t her period. Pt has a hx of Ovarian cysts and severe menstrual cramps. D/C'd birth control pills recently and reports 10/10 pain during periods since they resumed.

## 2023-09-16 NOTE — ED Triage Notes (Signed)
 First Nurse Note: Patient to ED via [facility] from home for menstrual cramps. Stopped birth control 4 months ago. Took Advil 2 hrs ago. VS WNL.

## 2023-09-16 NOTE — ED Provider Notes (Signed)
 [facility] Provider Note    Event Date/Time   First MD Initiated Contact with Patient 09/16/23 1310     (approximate)   History   Abdominal Pain   HPI  Alice Lewis is a 28 y.o. female presents today with history of right lower quadrant abdominal pain, patient describes the pain as  cramps are associated with her menstrual period.  Last menstrual period started yesterday.  Patient states she was taking oral contraceptives to decrease the cramps with her period but she stopped taking them 2 months ago.  Oral contraceptives were working preventing the dysmenorrhea patient isG0Po. Patient denies vaginal discharge, nauseas,fever, vomit, diarrhea.   Physical Exam   Triage Vital Signs: ED Triage Vitals  Encounter Vitals Group     BP 09/16/23 1135 (!) 90/59     Systolic BP Percentile --      Diastolic BP Percentile --      Pulse Rate 09/16/23 1135 83     Resp 09/16/23 1135 20     Temp 09/16/23 1135 98 F (36.7 C)     Temp Source 09/16/23 1135 Oral     SpO2 09/16/23 1135 99 %     Weight 09/16/23 1136 134 lb (60.8 kg)     Height 09/16/23 1136 5\' 8"  (1.727 m)     Head Circumference --      Peak Flow --      Pain Score 09/16/23 1134 8     Pain Loc --      Pain Education --      Exclude from Growth Chart --     Most recent vital signs: Vitals:   09/16/23 1135 09/16/23 1454  BP: (!) 90/59 115/79  Pulse: 83 68  Resp: 20 14  Temp: 98 F (36.7 C) 98.1 F (36.7 C)  SpO2: 99% 98%     Constitutional: Alert, nondistressed Eyes: Conjunctivae are normal.  Head: Atraumatic. Nose: No congestion/rhinnorhea. Mouth/Throat: Mucous membranes are moist.   Neck: Painless ROM.  Cardiovascular:   Good peripheral circulation. Respiratory: Normal respiratory effort.  No retractions.  Gastrointestinal: Skin without scars, bowel sounds positive.  McBurney's point negative Rovsing negative rebound negative Murphy negative.  Tender to deep palpation in right  lower quadrant. Musculoskeletal:  no deformity Neurologic:  MAE spontaneously. No gross focal neurologic deficits are appreciated.  Skin:  Skin is warm, dry and intact. No rash noted. Psychiatric: Mood and affect are normal. Speech and behavior are normal.    ED Results / Procedures / Treatments   Labs (all labs ordered are listed, but only abnormal results are displayed) Labs Reviewed  URINALYSIS, ROUTINE W REFLEX MICROSCOPIC - Abnormal; Notable for the following components:      Result Value   Color, Urine AMBER (*)    APPearance HAZY (*)    Hgb urine dipstick LARGE (*)    Protein, ur 30 (*)    All other components within normal limits  LIPASE, BLOOD  COMPREHENSIVE METABOLIC PANEL  CBC  POC URINE PREG, ED     EKG     RADIOLOGY I independently reviewed and interpreted imaging and agree with radiologists findings.      PROCEDURES:  Critical Care performed:   Procedures   MEDICATIONS ORDERED IN ED: Medications  ketorolac (TORADOL) 30 MG/ML injection 60 mg (60 mg Intramuscular Given 09/16/23 1340)     IMPRESSION / MDM / ASSESSMENT AND PLAN / ED COURSE  I reviewed the triage vital signs and the nursing notes.  Differential diagnosis includes, but is not limited to, ectopic pregnancy, ovarian torsion, endometriosis, appendicitis  Patient's presentation is most consistent with acute complicated illness / injury requiring diagnostic workup.   Patient's diagnosis is consistent with severe dysmenorrhea. I independently reviewed and interpreted imaging and agree with radiologists findings. Labs are  reassuring ruling out ovarian torsion, ectopic, endometriosis. I did review the patient's allergies and medications.The patient is in stable and satisfactory condition for discharge home . Patient will be discharged home with prescriptions for with Ortho cyclen, flexeril. Patient is to follow up with OB-GYN as needed or otherwise directed. Patient is given ED precautions to  return to the ED for any worsening or new symptoms. Discussed plan of care with patient, answered all of patient's questions, Patient agreeable to plan of care. Advised patient to take medications according to the instructions on the label. Discussed possible side effects of new medications. Patient verbalized understanding. Clinical Course as of 09/16/23 1609  Tue Sep 16, 2023  1311 Comprehensive metabolic panel [AE]  1314 Comprehensive metabolic panel [AE]  1314 Comprehensive metabolic panel Electrolytes within normal limits renal function within normal limits LFTs within normal limits anion gap within normal limits [AE]  1314 Lipase, blood Within normal limits [AE]  1315 CBC White blood cells hemoglobin within normal limits [AE]  1550 US PELVIC COMPLETE W TRANSVAGINAL AND TORSION R/O Normal transabdominal pelvic ultrasound.  [AE]    Clinical Course User Index [AE] Gladys Damme, PA-C     FINAL CLINICAL IMPRESSION(S) / ED DIAGNOSES   Final diagnoses:  Severe dysmenorrhea     Rx / DC Orders   ED Discharge Orders          Ordered    Norgestimate-Eth Estradiol (ORTHO TRI-CYCLEN LO) 0.18/0.215/0.25 MG-25 MCG TABS  Daily        09/16/23 1609    cyclobenzaprine (FLEXERIL) 10 MG tablet  3 times daily PRN        09/16/23 1609             Note:  This document was prepared using Dragon voice recognition software and may include unintentional dictation errors.   Gladys Damme, PA-C 09/16/23 1609    Phineas Semen, MD 09/17/23 [phone]

## 2023-09-16 NOTE — ED Triage Notes (Signed)
 Pt to ED from home [facility] for abdominal cramping since this AM. Vomited once this AM. Pt is on period and gets bad cramping with periods. Pt states at one time was told had ovarian cyst. Has been off birth control for 2 months. Pain is more to R side.

## 2023-09-16 NOTE — Discharge Instructions (Signed)
 You will have been diagnosed with severe dysmenorrhea.  Please take Ortho Tri-Cyclen 1 tablet/day.  Please take Flexeril 3 tablets/day for 4 days for pain.  Please call OB/GYN and make an appointment for a follow-up

## 2023-11-21 ENCOUNTER — Other Ambulatory Visit: Payer: Self-pay

## 2023-11-21 ENCOUNTER — Ambulatory Visit
Admission: EM | Admit: 2023-11-21 | Discharge: 2023-11-21 | Disposition: A | Discharge status: Home / Self Care | Payer: Self-pay | Attending: Family Medicine | Admitting: Family Medicine

## 2023-11-21 ENCOUNTER — Encounter: Payer: Self-pay | Admitting: Emergency Medicine

## 2023-11-21 ENCOUNTER — Emergency Department
Admission: EM | Admit: 2023-11-21 | Discharge: 2023-11-21 | Discharge status: Against Medical Advice | Payer: Self-pay | Attending: Emergency Medicine | Admitting: Emergency Medicine

## 2023-11-21 DIAGNOSIS — R1030 Lower abdominal pain, unspecified: Secondary | ICD-10-CM

## 2023-11-21 DIAGNOSIS — R109 Unspecified abdominal pain: Secondary | ICD-10-CM | POA: Insufficient documentation

## 2023-11-21 DIAGNOSIS — N946 Dysmenorrhea, unspecified: Secondary | ICD-10-CM

## 2023-11-21 DIAGNOSIS — Z5321 Procedure and treatment not carried out due to patient leaving prior to being seen by health care provider: Secondary | ICD-10-CM | POA: Insufficient documentation

## 2023-11-21 DIAGNOSIS — R110 Nausea: Secondary | ICD-10-CM | POA: Insufficient documentation

## 2023-11-21 LAB — CBC
HCT: 41.4 % (ref 36.0–46.0)
Hemoglobin: 13.7 g/dL (ref 12.0–15.0)
MCH: 28.4 pg (ref 26.0–34.0)
MCHC: 33.1 g/dL (ref 30.0–36.0)
MCV: 85.9 fL (ref 80.0–100.0)
Platelets: 260 10*3/uL (ref 150–400)
RBC: 4.82 MIL/uL (ref 3.87–5.11)
RDW: 13.1 % (ref 11.5–15.5)
WBC: 8.2 10*3/uL (ref 4.0–10.5)
nRBC: 0 % (ref 0.0–0.2)

## 2023-11-21 LAB — COMPREHENSIVE METABOLIC PANEL WITH GFR
ALT: 12 U/L (ref 0–44)
AST: 16 U/L (ref 15–41)
Albumin: 4.1 g/dL (ref 3.5–5.0)
Alkaline Phosphatase: 58 U/L (ref 38–126)
Anion gap: 10 (ref 5–15)
BUN: 10 mg/dL (ref 6–20)
CO2: 20 mmol/L — ABNORMAL LOW (ref 22–32)
Calcium: 9.2 mg/dL (ref 8.9–10.3)
Chloride: 106 mmol/L (ref 98–111)
Creatinine, Ser: 0.69 mg/dL (ref 0.44–1.00)
GFR, Estimated: >60 mL/min (ref >60)
Glucose, Bld: 104 mg/dL — ABNORMAL HIGH (ref 70–99)
Potassium: 3.6 mmol/L (ref 3.5–5.1)
Sodium: 136 mmol/L (ref 135–145)
Total Bilirubin: 0.8 mg/dL (ref 0.0–1.2)
Total Protein: 6.9 g/dL (ref 6.5–8.1)

## 2023-11-21 LAB — LIPASE, BLOOD: Lipase: 29 U/L (ref 11–51)

## 2023-11-21 MED ORDER — KETOROLAC TROMETHAMINE 60 MG/2ML IM SOLN
30.0000 mg | Freq: Once | INTRAMUSCULAR | Status: AC
  Administered 2023-11-21: 30 mg via INTRAMUSCULAR

## 2023-11-21 NOTE — ED Notes (Signed)
EMS Called for patient transport.

## 2023-11-21 NOTE — ED Triage Notes (Signed)
 Pt in from [facility] via [facility] for severe abdominal cramping, currently on day 1 on menstrual cycle. Given 30mg  IM Toradol  at 2005 and UC. +nausea, denies any heavier than usual vaginal blood loss or clots. VSS stable during transport - 105/65 and HR 70

## 2023-11-21 NOTE — ED Notes (Signed)
 Pt up to desk with family member, states she is going home. Pt visualized in NAD at this time.

## 2023-11-21 NOTE — ED Notes (Signed)
Pt cannot provide urine sample at this time.  

## 2023-11-21 NOTE — ED Triage Notes (Signed)
 Patient is with her wife  Patient is having period cramps  Patient reports pain to be 1000/10  Patient has tried nausea medication, tylenol  otc medication  Patient is laying down on the bed and states that the pain keeps her from sitting still. Patient is doubled over and holding her stomach.  Patient reports a history of extremely painful periods and states that her period just started today.

## 2023-11-21 NOTE — ED Provider Notes (Signed)
[facility]    CSN: [id number] Arrival date & time: 11/21/23  1946      History   Chief Complaint Abdominal cramping    HPI HPI Myfanwy Simkin is a 28 y.o. female.   History provided by pt and her wife, mom via telephone  Edan Johnigan presents for severe abdominal cramping with vomiting.  Has had 2 episodes of vomiting today. Wife gave her Zofran  prior to arrival and Laporche has not had any more vomiting. Wife gave her a muscle relaxer too but that didn't help.  Pain constant and described as twisting. Rated "1000/10."Takes OrthoCyclen  for period related pain.  Denies missing any doses. Her period started today. Pain got worse this afternoon. Mom on the phone says Jermia started having periods at 54 year old. Dad's female pain have fibroids.  Tramadol  doesn't work for her.  She typically gets IV medication. They went to the ED but there was 4 hour wait so they came here instead. Pain has gotten bad over the years. Mom is in [place] .   Meliss is looking for a new gynecologist as she keeps going through pain nearly every month.       Past Medical History:  Diagnosis Date   Asthma    Ovarian cyst     There are no active problems to display for this patient.   Past Surgical History:  Procedure Laterality Date   NO PAST SURGERIES      OB History   No obstetric history on file.      Home Medications    Prior to Admission medications   Medication Sig Start Date End Date Taking? Authorizing Provider  acetaminophen -codeine  (TYLENOL  #3) 300-30 MG tablet Take 1 tablet by mouth every 6 (six) hours as needed for moderate pain. 03/23/23   Reena Canning, NP  amoxicillin -clavulanate (AUGMENTIN ) 875-125 MG tablet Take 1 tablet by mouth every 12 (twelve) hours. 03/23/23   White, Maybelle Spatz, NP  baclofen  (LIORESAL ) 10 MG tablet Take 1 tablet (10 mg total) by mouth 3 (three) times daily. 08/26/22   Kent Pear, NP  naproxen  (NAPROSYN ) 500 MG tablet  Take 1 tablet (500 mg total) by mouth 2 (two) times daily with a meal. 05/19/23   Babygirl Trager, DO  Norgestimate-Eth Estradiol (ORTHO TRI-CYCLEN LO) 0.18/0.215/0.25 MG-25 MCG TABS Take 1 tablet by mouth daily. 09/16/23 09/15/24  Evans, Alexandra, PA-C  norgestimate-ethinyl estradiol (ORTHO-CYCLEN) 0.25-35 MG-MCG tablet Take 1 tablet by mouth daily. 07/25/21   [provider]    Family History Family History  Problem Relation Age of Onset   Stroke Mother    Healthy Father     Social History Social History   Tobacco Use   Smoking status: Every Day    Types: Cigarettes, Cigars   Smokeless tobacco: Never  Vaping Use   Vaping status: Former  Substance Use Topics   Alcohol use: Not Currently    Comment: Occ.   Drug use: Yes    Types: Marijuana     Allergies   Patient has no known allergies.   Review of Systems Review of Systems: :negative unless otherwise stated in HPI.      Physical Exam Triage Vital Signs ED Triage Vitals  Encounter Vitals Group     BP --      Systolic BP Percentile --      Diastolic BP Percentile --      Pulse Rate 11/21/23 2000 84     Resp --  Temp 11/21/23 2000 97.6 F (36.4 C)     Temp src --      SpO2 --      Weight --      Height --      Head Circumference --      Peak Flow --      Pain Score 11/21/23 1959 10     Pain Loc --      Pain Education --      Exclude from Growth Chart --    No data found.  Updated Vital Signs BP 110/61 (BP Location: Right Arm)   Pulse 84   Temp 97.6 F (36.4 C) (Oral)   LMP 11/21/2023   SpO2 100%   Visual Acuity Right Eye Distance:   Left Eye Distance:   Bilateral Distance:    Right Eye Near:   Left Eye Near:    Bilateral Near:     Physical Exam GEN: tearful, uncomfortable appearing female, writhing in pain on exam table  CVS: well perfused  RESP: speaking in full sentences without pause  ABD: soft, generalized TTP,  non-distended, no palpable masses   GU: deferred,   Labs (all labs ordered are listed, but only abnormal results are displayed) Labs Reviewed - No data to display  EKG   Radiology No results found.  Procedures Procedures (including critical care time)  Medications Ordered in UC Medications  ketorolac  (TORADOL ) injection 30 mg (30 mg Intramuscular Given 11/21/23 2005)    Initial Impression / Assessment and Plan / UC Course  I have reviewed the triage vital signs and the nursing notes.  Pertinent labs & imaging results that were available during my care of the patient were reviewed by me and considered in my medical decision making (see chart for details).      Patient is a 28 y.o.Aaron Aas female with history of ovarian cysts and dysmenorrhea who presents for period cramping.  Overall patient is uncomfortable afebrile.  Vital signs stable.  She is afebrile. Pt writhing around on the exam table in pain. She is crying and yelling out for help.    Pt given Toradol  30 mg IM here without any relief.  She is still crying and balled up while yelling out for pain. She likely needs IV pain control and imaging that I am not able to perform here. I suspect endometriosis. She is taking Flexeril  and Orthocyclen for her periods but this is not helping. I am not able to rule out ovarian torsion or TOA here.   Discussed ED evaluation and wife requested EMS transfer. Pt agreeable.  EMS called and updated upon arrival. Spoke with [facility] ED charge RN regarding pt's symptoms.  They will await her arrival.    Discussed MDM, treatment plan and plan for follow-up with patient who agrees with plan.        Final Clinical Impressions(s) / UC Diagnoses   Final diagnoses:  Severe dysmenorrhea  Lower abdominal pain     Discharge Instructions      I am concerned that you may have endometriosis or some other pelvic abnormality.  You have been advised to follow up immediately in the emergency department for concerning signs or symptoms as discussed during your  visit. If you declined EMS transport, please have a family member take you directly to the ED at this time. Do not delay.       ED Prescriptions   None    PDMP not reviewed this encounter.   Daren Doswell, DO 11/21/23  2041  

## 2023-11-21 NOTE — Discharge Instructions (Signed)
 I am concerned that you may have endometriosis or some other pelvic abnormality.  You have been advised to follow up immediately in the emergency department for concerning signs or symptoms as discussed during your visit. If you declined EMS transport, please have a family member take you directly to the ED at this time. Do not delay.

## 2023-11-21 NOTE — ED Notes (Signed)
 Patient is being discharged from the Urgent Care and sent to the Emergency Department via EMS . Per Dr. Percilla Boys, patient is in need of higher level of care due to Abdominal Pain. Patient is aware and verbalizes understanding of plan of care.  Vitals:   11/21/23 2000  BP: 110/61  Pulse: 84  Temp: 97.6 F (36.4 C)  SpO2: 100%

## 2023-11-21 NOTE — ED Notes (Signed)
 Per Dr. Hendrick Locke, will await bloodwork results first and hold off on imagining at this time

## 2023-11-22 ENCOUNTER — Emergency Department
Admission: EM | Admit: 2023-11-22 | Discharge: 2023-11-22 | Disposition: A | Discharge status: Home / Self Care | Payer: Self-pay | Attending: Emergency Medicine | Admitting: Emergency Medicine

## 2023-11-22 DIAGNOSIS — N946 Dysmenorrhea, unspecified: Secondary | ICD-10-CM | POA: Insufficient documentation

## 2023-11-22 LAB — URINALYSIS, ROUTINE W REFLEX MICROSCOPIC
Bacteria, UA: NONE SEEN
Bilirubin Urine: NEGATIVE
Glucose, UA: NEGATIVE mg/dL
Ketones, ur: NEGATIVE mg/dL
Leukocytes,Ua: NEGATIVE
Nitrite: NEGATIVE
Protein, ur: 30 mg/dL — AB
Specific Gravity, Urine: 1.012 (ref 1.005–1.030)
pH: 5 (ref 5.0–8.0)

## 2023-11-22 LAB — COMPREHENSIVE METABOLIC PANEL WITH GFR
ALT: 14 U/L (ref 0–44)
AST: 19 U/L (ref 15–41)
Albumin: 4.2 g/dL (ref 3.5–5.0)
Alkaline Phosphatase: 64 U/L (ref 38–126)
Anion gap: 10 (ref 5–15)
BUN: 11 mg/dL (ref 6–20)
CO2: 23 mmol/L (ref 22–32)
Calcium: 9.5 mg/dL (ref 8.9–10.3)
Chloride: 105 mmol/L (ref 98–111)
Creatinine, Ser: 0.95 mg/dL (ref 0.44–1.00)
GFR, Estimated: >60 mL/min (ref >60)
Glucose, Bld: 92 mg/dL (ref 70–99)
Potassium: 3.7 mmol/L (ref 3.5–5.1)
Sodium: 138 mmol/L (ref 135–145)
Total Bilirubin: 1.1 mg/dL (ref 0.0–1.2)
Total Protein: 7.2 g/dL (ref 6.5–8.1)

## 2023-11-22 LAB — CBC
HCT: 42.6 % (ref 36.0–46.0)
Hemoglobin: 14 g/dL (ref 12.0–15.0)
MCH: 28.6 pg (ref 26.0–34.0)
MCHC: 32.9 g/dL (ref 30.0–36.0)
MCV: 86.9 fL (ref 80.0–100.0)
Platelets: 262 10*3/uL (ref 150–400)
RBC: 4.9 MIL/uL (ref 3.87–5.11)
RDW: 13.2 % (ref 11.5–15.5)
WBC: 7.8 10*3/uL (ref 4.0–10.5)
nRBC: 0 % (ref 0.0–0.2)

## 2023-11-22 LAB — LIPASE, BLOOD: Lipase: 31 U/L (ref 11–51)

## 2023-11-22 LAB — PREGNANCY, URINE: Preg Test, Ur: NEGATIVE

## 2023-11-22 MED ORDER — KETOROLAC TROMETHAMINE 15 MG/ML IJ SOLN
30.0000 mg | Freq: Once | INTRAMUSCULAR | Status: AC
  Administered 2023-11-22: 30 mg via INTRAMUSCULAR
  Filled 2023-11-22: qty 2

## 2023-11-22 NOTE — ED Provider Notes (Signed)
 [facility] AT [facility] REGIONAL Provider Note   CSN: [id number] Arrival date & time: 11/22/23  1220     History  Chief Complaint  Patient presents with   Abdominal Pain    Alice Lewis is a 28 y.o. female.  See her patient here for abdominal pain.  She notes acute onset yesterday of lower abdominal pain and vaginal bleeding consistent with her menses.  She is on her normal..  She does note that she has had similar episodes of pain for the past 4 months since changing her birth control pill.  She did have significant improvement yesterday after getting a Toradol  shot at urgent care.  Denies any fevers or chills.   Abdominal Pain      Home Medications Prior to Admission medications   Medication Sig Start Date End Date Taking? Authorizing Provider  acetaminophen -codeine  (TYLENOL  #3) 300-30 MG tablet Take 1 tablet by mouth every 6 (six) hours as needed for moderate pain. 03/23/23   Reena Canning, NP  amoxicillin -clavulanate (AUGMENTIN ) 875-125 MG tablet Take 1 tablet by mouth every 12 (twelve) hours. 03/23/23   White, Maybelle Spatz, NP  baclofen  (LIORESAL ) 10 MG tablet Take 1 tablet (10 mg total) by mouth 3 (three) times daily. 08/26/22   Kent Pear, NP  naproxen  (NAPROSYN ) 500 MG tablet Take 1 tablet (500 mg total) by mouth 2 (two) times daily with a meal. 05/19/23   Brimage, Vondra, DO  Norgestimate-Eth Estradiol (ORTHO TRI-CYCLEN LO) 0.18/0.215/0.25 MG-25 MCG TABS Take 1 tablet by mouth daily. 09/16/23 09/15/24  Evans, Alexandra, PA-C  norgestimate-ethinyl estradiol (ORTHO-CYCLEN) 0.25-35 MG-MCG tablet Take 1 tablet by mouth daily. 07/25/21   [provider]      Allergies    Patient has no known allergies.    Review of Systems   Review of Systems  Gastrointestinal:  Positive for abdominal pain.    Physical Exam Updated Vital Signs BP 106/76 (BP Location: Right Arm)   Pulse 80   Temp 98.4 F (36.9 C) (Oral)   Resp 18   LMP 11/21/2023  (Exact Date)   SpO2 96%  Physical Exam Vitals reviewed.  Constitutional:      Appearance: Normal appearance.  HENT:     Head: Normocephalic and atraumatic.     Nose: Nose normal.  Cardiovascular:     Pulses: Normal pulses.  Pulmonary:     Effort: Pulmonary effort is normal.  Abdominal:     General: Abdomen is flat. Bowel sounds are normal. There is no distension. There are no signs of injury.     Palpations: Abdomen is soft.     Tenderness: There is no abdominal tenderness.  Musculoskeletal:     Cervical back: Normal range of motion.  Neurological:     Mental Status: She is alert and oriented to person, place, and time. Mental status is at baseline.  Psychiatric:        Mood and Affect: Mood normal.        Behavior: Behavior normal.     ED Results / Procedures / Treatments   Labs (all labs ordered are listed, but only abnormal results are displayed) Labs Reviewed  URINALYSIS, ROUTINE W REFLEX MICROSCOPIC - Abnormal; Notable for the following components:      Result Value   Color, Urine YELLOW (*)    APPearance HAZY (*)    Hgb urine dipstick LARGE (*)    Protein, ur 30 (*)    All other components within normal limits  LIPASE, BLOOD  COMPREHENSIVE METABOLIC PANEL WITH GFR  CBC  PREGNANCY, URINE  POC URINE PREG, ED    EKG None  Radiology No results found.  Procedures Procedures    Medications Ordered in ED Medications  ketorolac  (TORADOL ) 15 MG/ML injection 30 mg (30 mg Intramuscular Given 11/22/23 1525)    ED Course/ Medical Decision Making/ A&P                                 Medical Decision Making Patient here for abdominal pain and menses.  She does note that she has had similar episodes for the past several months consistent with same.  She is hemodynamically stable afebrile nontachycardic she has no acute abdominal tenderness on examination.  I have a low suspicion of intra-abdominal pathology.  Her symptoms are consistent with a dysmenorrhea  especially given the recurrent symptoms over the past several months.  She has had good improvement with Toradol  I will recommend Toradol  here.  Review of chart last month she had a similar episode here she had a negative pelvic ultrasound and negative torsion at that point.  Do not suspect torsion today.  She has no leukocytosis no renal failure no significant anemia her hemoglobin is 14.  No evidence of acute urinary tract infection.  Do suspect dysmenorrhea will recommend anti-inflammatories and Tylenol .  Close follow-up with primary care/OB/GYN for further recommendation of birth control medication.  Patient is in agreement.  She would likely wish to get on Depo.    Amount and/or Complexity of Data Reviewed Labs: ordered.  Risk Prescription drug management.           Final Clinical Impression(s) / ED Diagnoses Final diagnoses:  Dysmenorrhea    Rx / DC Orders ED Discharge Orders     None         Hollie Luria, [place] 11/22/23 1534    Lind Repine, MD 11/22/23 [phone]

## 2023-11-22 NOTE — ED Triage Notes (Signed)
 Pt to ED from home via EMS with lower abdominal pain and N/V since yesterday. Pt was seen yesterday for the same symptoms. Denies urinary symptoms/vaginal bleeding.

## 2023-11-22 NOTE — Discharge Instructions (Signed)
 Please take Tylenol  650 mg 4 times daily for pain.  You may also take ibuprofen  up to 600 mg 3 times daily with food to help with symptoms.  Please follow-up with your primary care doctor for further recommendations.  You have also been referred to OB/GYN doctor.

## 2023-11-22 NOTE — ED Provider Triage Note (Signed)
 Emergency Medicine Provider Triage Evaluation Note  Alice Lewis , a 28 y.o. female  was evaluated in triage.  Pt complains of abdominal pain, seen for the same yesterday.  Review of Systems  Positive:  Negative:   Physical Exam  LMP 11/21/2023 (Exact Date)  Gen:   Awake, no distress   Resp:  Normal effort  MSK:   Moves extremities without difficulty  Other:    Medical Decision Making  Medically screening exam initiated at 12:28 PM.  Appropriate orders placed.  Jeaneen Pyka was informed that the remainder of the evaluation will be completed by another provider, this initial triage assessment does not replace that evaluation, and the importance of remaining in the ED until their evaluation is complete.     Delsie Figures, PA-C 11/22/23 1228

## 2023-12-24 ENCOUNTER — Encounter: Payer: Self-pay | Admitting: Advanced Practice Midwife

## 2024-02-05 ENCOUNTER — Encounter: Payer: Self-pay | Admitting: Obstetrics

## 2024-07-16 ENCOUNTER — Encounter: Payer: Self-pay | Admitting: Emergency Medicine

## 2024-07-16 ENCOUNTER — Ambulatory Visit
Admission: EM | Admit: 2024-07-16 | Discharge: 2024-07-16 | Disposition: A | Discharge status: Home / Self Care | Attending: Emergency Medicine | Admitting: Emergency Medicine

## 2024-07-16 DIAGNOSIS — R051 Acute cough: Secondary | ICD-10-CM | POA: Diagnosis not present

## 2024-07-16 DIAGNOSIS — J069 Acute upper respiratory infection, unspecified: Secondary | ICD-10-CM | POA: Diagnosis not present

## 2024-07-16 LAB — POC SOFIA SARS ANTIGEN FIA: SARS Coronavirus 2 Ag: NEGATIVE

## 2024-07-16 MED ORDER — PROMETHAZINE-DM 6.25-15 MG/5ML PO SYRP: 5.0000 mL | ORAL_SOLUTION | Freq: Four times a day (QID) | ORAL | 0 refills | Status: AC | PRN

## 2024-07-16 MED ORDER — IPRATROPIUM BROMIDE 0.06 % NA SOLN: 2.0000 | Freq: Four times a day (QID) | NASAL | 12 refills | Status: AC

## 2024-07-16 MED ORDER — BENZONATATE 100 MG PO CAPS: 200.0000 mg | ORAL_CAPSULE | Freq: Three times a day (TID) | ORAL | 0 refills | Status: AC

## 2024-07-16 NOTE — ED Provider Notes (Signed)
 " [facility]    CSN: [id number] Arrival date & time: 07/16/24  0906      History   Chief Complaint Chief Complaint  Patient presents with   Cough   Generalized Body Aches    HPI Alice Lewis is a 28 y.o. female.   HPI  28 year old female with past medical history significant for asthma and ovarian cyst presents for evaluation of flulike symptoms that began 3 days ago that include hot and cold chills, runny nose, nasal congestion, ear tingling, and a nonproductive cough.  No measured fever.  Past Medical History:  Diagnosis Date   Asthma    Ovarian cyst     There are no active problems to display for this patient.   Past Surgical History:  Procedure Laterality Date   NO PAST SURGERIES      OB History   No obstetric history on file.      Home Medications    Prior to Admission medications  Medication Sig Start Date End Date Taking? Authorizing Provider  benzonatate  (TESSALON ) 100 MG capsule Take 2 capsules (200 mg total) by mouth every 8 (eight) hours. 07/16/24  Yes Bernardino Ditch, NP  ipratropium (ATROVENT ) 0.06 % nasal spray Place 2 sprays into both nostrils 4 (four) times daily. 07/16/24  Yes Bernardino Ditch, NP  promethazine -dextromethorphan (PROMETHAZINE -DM) 6.25-15 MG/5ML syrup Take 5 mLs by mouth 4 (four) times daily as needed. 07/16/24  Yes Bernardino Ditch, NP  Norgestimate -Alline Estradiol  (ORTHO TRI-CYCLEN LO) 0.18/0.215/0.25 MG-25 MCG TABS Take 1 tablet by mouth daily. 09/16/23 09/15/24  Evans, Alexandra, PA-C  norgestimate -ethinyl estradiol  (ORTHO-CYCLEN) 0.25-35 MG-MCG tablet Take 1 tablet by mouth daily. 07/25/21   [provider]    Family History Family History  Problem Relation Age of Onset   Stroke Mother    Healthy Father     Social History Social History[1]   Allergies   Patient has no known allergies.   Review of Systems Review of Systems  Constitutional:  Negative for fever.  HENT:  Positive for congestion, ear  pain and rhinorrhea. Negative for sore throat.   Respiratory:  Positive for cough.      Physical Exam Triage Vital Signs ED Triage Vitals  Encounter Vitals Group     BP      Girls Systolic BP Percentile      Girls Diastolic BP Percentile      Boys Systolic BP Percentile      Boys Diastolic BP Percentile      Pulse      Resp      Temp      Temp src      SpO2      Weight      Height      Head Circumference      Peak Flow      Pain Score      Pain Loc      Pain Education      Exclude from Growth Chart    No data found.  Updated Vital Signs BP 108/72 (BP Location: Right Arm)   Pulse 95   Temp 99.4 F (37.4 C) (Oral)   Resp 18   Wt 158 lb 11.2 oz (72 kg)   SpO2 96%   BMI 24.13 kg/m   Visual Acuity Right Eye Distance:   Left Eye Distance:   Bilateral Distance:    Right Eye Near:   Left Eye Near:    Bilateral Near:     Physical Exam Vitals  and nursing note reviewed.  Constitutional:      Appearance: Normal appearance. She is ill-appearing.  HENT:     Head: Normocephalic and atraumatic.     Right Ear: Tympanic membrane, ear canal and external ear normal. There is no impacted cerumen.     Left Ear: Tympanic membrane, ear canal and external ear normal. There is no impacted cerumen.     Nose: Congestion and rhinorrhea present.     Comments: This mucosa is edematous and mildly erythematous with scant clear discharge in both nares.    Mouth/Throat:     Mouth: Mucous membranes are moist.     Pharynx: Oropharynx is clear. No oropharyngeal exudate or posterior oropharyngeal erythema.  Cardiovascular:     Rate and Rhythm: Normal rate and regular rhythm.     Pulses: Normal pulses.     Heart sounds: Normal heart sounds. No murmur heard.    No friction rub. No gallop.  Pulmonary:     Effort: Pulmonary effort is normal.     Breath sounds: Normal breath sounds. No wheezing, rhonchi or rales.  Musculoskeletal:     Cervical back: Normal range of motion and neck supple.  No tenderness.  Lymphadenopathy:     Cervical: No cervical adenopathy.  Skin:    General: Skin is warm and dry.     Capillary Refill: Capillary refill takes less than 2 seconds.     Findings: No rash.  Neurological:     General: No focal deficit present.     Mental Status: She is alert and oriented to person, place, and time.      UC Treatments / Results  Labs (all labs ordered are listed, but only abnormal results are displayed) Labs Reviewed  POC SOFIA SARS ANTIGEN FIA - Normal    EKG   Radiology No results found.  Procedures Procedures (including critical care time)  Medications Ordered in UC Medications - No data to display  Initial Impression / Assessment and Plan / UC Course  I have reviewed the triage vital signs and the nursing notes.  Pertinent labs & imaging results that were available during my care of the patient were reviewed by me and considered in my medical decision making (see chart for details).   Patient is a pleasant, though mildly ill-appearing, 28 year old female presenting for evaluation of 3 days worth of flu and COVID-like symptoms as outlined in HPI above.  Her physical exam does reveal inflammation of her upper respiratory tract but her lung sounds are clear to auscultation all fields.  Due to the fact that she is on day 3 of symptoms I would not test her for influenza at this time but I will order a COVID PCR.  Also possible that this is one of the other respiratory viruses circulating in the community at this time.  COVID antigen test is negative.  I will discharge patient with a diagnosis of viral URI with a cough.  Prescribed Atrovent  nasal spray for nasal congestion and Tessalon  Perles and Promethazine  DM cough syrup for cough and congestion.  Return precautions reviewed.  Work note provided.   Final Clinical Impressions(s) / UC Diagnoses   Final diagnoses:  Acute cough  Viral URI with cough     Discharge Instructions      Your  COVID test today was negative.  Your exam is consistent with a viral respiratory infection.  Please use over-the-counter Tylenol  and/or ibuprofen  according to the package instructions as needed for any fever or pain.Use the  Atrovent  nasal spray, 2 squirts in each nostril every 6 hours, as needed for runny nose and postnasal drip.  Use the Tessalon  Perles every 8 hours during the day.  Take them with a small sip of water.  They may give you some numbness to the base of your tongue or a metallic taste in your mouth, this is normal.  Use the Promethazine  DM cough syrup at bedtime for cough and congestion.  It will make you drowsy so do not take it during the day.  Return for reevaluation or see your primary care provider for any new or worsening symptoms.      ED Prescriptions     Medication Sig Dispense Auth. Provider   benzonatate  (TESSALON ) 100 MG capsule Take 2 capsules (200 mg total) by mouth every 8 (eight) hours. 21 capsule Bernardino Ditch, NP   ipratropium (ATROVENT ) 0.06 % nasal spray Place 2 sprays into both nostrils 4 (four) times daily. 15 mL Bernardino Ditch, NP   promethazine -dextromethorphan (PROMETHAZINE -DM) 6.25-15 MG/5ML syrup Take 5 mLs by mouth 4 (four) times daily as needed. 118 mL Bernardino Ditch, NP      PDMP not reviewed this encounter.     [1]  Social History Tobacco Use   Smoking status: Every Day    Types: Cigarettes, Cigars   Smokeless tobacco: Never  Vaping Use   Vaping status: Former  Substance Use Topics   Alcohol use: Not Currently    Comment: Occ.   Drug use: Yes    Types: Marijuana     Bernardino Ditch, NP 07/16/24 1213  "

## 2024-07-16 NOTE — Discharge Instructions (Signed)
 Your COVID test today was negative.  Your exam is consistent with a viral respiratory infection.  Please use over-the-counter Tylenol  and/or ibuprofen  according to the package instructions as needed for any fever or pain.Use the Atrovent  nasal spray, 2 squirts in each nostril every 6 hours, as needed for runny nose and postnasal drip.  Use the Tessalon  Perles every 8 hours during the day.  Take them with a small sip of water.  They may give you some numbness to the base of your tongue or a metallic taste in your mouth, this is normal.  Use the Promethazine  DM cough syrup at bedtime for cough and congestion.  It will make you drowsy so do not take it during the day.  Return for reevaluation or see your primary care provider for any new or worsening symptoms.

## 2024-07-16 NOTE — ED Triage Notes (Signed)
 Pt present cough with congestion , symptom started three days ago. Pt state having stingy in her nose. Pt took OTC medication no relief.
